# Patient Record
Sex: Male | Born: 1940 | Race: White | Hispanic: No | Marital: Married | Smoking: Former smoker
Health system: Southern US, Community
[De-identification: ages and names within clinical notes are randomized; demographics above are authoritative.]

## PROBLEM LIST (undated history)

## (undated) DIAGNOSIS — I10 Essential (primary) hypertension: Secondary | ICD-10-CM

## (undated) DIAGNOSIS — N183 Chronic kidney disease, stage 3 unspecified: Secondary | ICD-10-CM

## (undated) DIAGNOSIS — I639 Cerebral infarction, unspecified: Secondary | ICD-10-CM

## (undated) DIAGNOSIS — C61 Malignant neoplasm of prostate: Secondary | ICD-10-CM

## (undated) DIAGNOSIS — I350 Nonrheumatic aortic (valve) stenosis: Secondary | ICD-10-CM

## (undated) DIAGNOSIS — I2510 Atherosclerotic heart disease of native coronary artery without angina pectoris: Secondary | ICD-10-CM

---

## 1998-02-19 ENCOUNTER — Emergency Department (HOSPITAL_COMMUNITY): Admission: EM | Admit: 1998-02-19 | Discharge: 1998-02-20 | Payer: Self-pay | Admitting: Emergency Medicine

## 1998-09-24 ENCOUNTER — Encounter: Payer: Self-pay | Admitting: Urology

## 1998-09-26 ENCOUNTER — Inpatient Hospital Stay (HOSPITAL_COMMUNITY): Admission: RE | Admit: 1998-09-26 | Discharge: 1998-09-27 | Payer: Self-pay | Admitting: Urology

## 1998-10-10 ENCOUNTER — Encounter: Payer: Self-pay | Admitting: Emergency Medicine

## 1998-10-10 ENCOUNTER — Inpatient Hospital Stay (HOSPITAL_COMMUNITY): Admission: EM | Admit: 1998-10-10 | Discharge: 1998-10-13 | Payer: Self-pay | Admitting: General Surgery

## 2001-03-21 ENCOUNTER — Encounter: Admission: RE | Admit: 2001-03-21 | Discharge: 2001-03-21 | Payer: Self-pay | Admitting: Internal Medicine

## 2001-03-21 ENCOUNTER — Encounter: Payer: Self-pay | Admitting: Internal Medicine

## 2002-10-20 ENCOUNTER — Encounter: Payer: Self-pay | Admitting: Emergency Medicine

## 2002-10-20 ENCOUNTER — Inpatient Hospital Stay (HOSPITAL_COMMUNITY): Admission: EM | Admit: 2002-10-20 | Discharge: 2002-10-21 | Payer: Self-pay

## 2003-03-12 ENCOUNTER — Encounter: Admission: RE | Admit: 2003-03-12 | Discharge: 2003-03-12 | Payer: Self-pay | Admitting: Internal Medicine

## 2003-03-12 ENCOUNTER — Encounter: Payer: Self-pay | Admitting: Internal Medicine

## 2019-10-04 ENCOUNTER — Other Ambulatory Visit: Payer: Self-pay

## 2019-10-04 ENCOUNTER — Inpatient Hospital Stay (HOSPITAL_COMMUNITY)
Admission: EM | Disposition: A | Discharge status: Home / Self Care | Payer: Self-pay | Source: Home / Self Care | Attending: Cardiovascular Disease

## 2019-10-04 ENCOUNTER — Inpatient Hospital Stay (HOSPITAL_COMMUNITY): Payer: Medicare Other

## 2019-10-04 ENCOUNTER — Emergency Department (HOSPITAL_COMMUNITY): Payer: Medicare Other

## 2019-10-04 ENCOUNTER — Inpatient Hospital Stay (HOSPITAL_COMMUNITY)
Admission: EM | Admit: 2019-10-04 | Discharge: 2019-10-09 | DRG: 280 | Disposition: A | Discharge status: Home / Self Care | Payer: Medicare Other | Attending: Cardiovascular Disease | Admitting: Cardiovascular Disease

## 2019-10-04 DIAGNOSIS — Z8673 Personal history of transient ischemic attack (TIA), and cerebral infarction without residual deficits: Secondary | ICD-10-CM

## 2019-10-04 DIAGNOSIS — S2239XA Fracture of one rib, unspecified side, initial encounter for closed fracture: Secondary | ICD-10-CM | POA: Diagnosis not present

## 2019-10-04 DIAGNOSIS — E7439 Other disorders of intestinal carbohydrate absorption: Secondary | ICD-10-CM | POA: Diagnosis present

## 2019-10-04 DIAGNOSIS — R778 Other specified abnormalities of plasma proteins: Secondary | ICD-10-CM

## 2019-10-04 DIAGNOSIS — R000 Tachycardia, unspecified: Secondary | ICD-10-CM | POA: Diagnosis not present

## 2019-10-04 DIAGNOSIS — S271XXA Traumatic hemothorax, initial encounter: Secondary | ICD-10-CM | POA: Diagnosis present

## 2019-10-04 DIAGNOSIS — W001XXA Fall from stairs and steps due to ice and snow, initial encounter: Secondary | ICD-10-CM | POA: Diagnosis present

## 2019-10-04 DIAGNOSIS — I350 Nonrheumatic aortic (valve) stenosis: Secondary | ICD-10-CM | POA: Diagnosis present

## 2019-10-04 DIAGNOSIS — Z20822 Contact with and (suspected) exposure to covid-19: Secondary | ICD-10-CM | POA: Diagnosis present

## 2019-10-04 DIAGNOSIS — D62 Acute posthemorrhagic anemia: Secondary | ICD-10-CM | POA: Diagnosis not present

## 2019-10-04 DIAGNOSIS — J90 Pleural effusion, not elsewhere classified: Secondary | ICD-10-CM | POA: Diagnosis not present

## 2019-10-04 DIAGNOSIS — Z888 Allergy status to other drugs, medicaments and biological substances status: Secondary | ICD-10-CM | POA: Diagnosis not present

## 2019-10-04 DIAGNOSIS — Z885 Allergy status to narcotic agent status: Secondary | ICD-10-CM

## 2019-10-04 DIAGNOSIS — I25119 Atherosclerotic heart disease of native coronary artery with unspecified angina pectoris: Secondary | ICD-10-CM | POA: Diagnosis present

## 2019-10-04 DIAGNOSIS — Z79899 Other long term (current) drug therapy: Secondary | ICD-10-CM

## 2019-10-04 DIAGNOSIS — F419 Anxiety disorder, unspecified: Secondary | ICD-10-CM | POA: Diagnosis present

## 2019-10-04 DIAGNOSIS — J9811 Atelectasis: Secondary | ICD-10-CM | POA: Diagnosis present

## 2019-10-04 DIAGNOSIS — Z87891 Personal history of nicotine dependence: Secondary | ICD-10-CM | POA: Diagnosis not present

## 2019-10-04 DIAGNOSIS — N400 Enlarged prostate without lower urinary tract symptoms: Secondary | ICD-10-CM | POA: Diagnosis present

## 2019-10-04 DIAGNOSIS — J942 Hemothorax: Secondary | ICD-10-CM

## 2019-10-04 DIAGNOSIS — Z7902 Long term (current) use of antithrombotics/antiplatelets: Secondary | ICD-10-CM

## 2019-10-04 DIAGNOSIS — I2584 Coronary atherosclerosis due to calcified coronary lesion: Secondary | ICD-10-CM | POA: Diagnosis present

## 2019-10-04 DIAGNOSIS — K219 Gastro-esophageal reflux disease without esophagitis: Secondary | ICD-10-CM | POA: Diagnosis present

## 2019-10-04 DIAGNOSIS — I213 ST elevation (STEMI) myocardial infarction of unspecified site: Secondary | ICD-10-CM

## 2019-10-04 DIAGNOSIS — I214 Non-ST elevation (NSTEMI) myocardial infarction: Secondary | ICD-10-CM | POA: Diagnosis present

## 2019-10-04 DIAGNOSIS — S2242XA Multiple fractures of ribs, left side, initial encounter for closed fracture: Secondary | ICD-10-CM | POA: Diagnosis present

## 2019-10-04 DIAGNOSIS — I2510 Atherosclerotic heart disease of native coronary artery without angina pectoris: Secondary | ICD-10-CM

## 2019-10-04 DIAGNOSIS — Z923 Personal history of irradiation: Secondary | ICD-10-CM

## 2019-10-04 DIAGNOSIS — Z886 Allergy status to analgesic agent status: Secondary | ICD-10-CM

## 2019-10-04 DIAGNOSIS — Z8546 Personal history of malignant neoplasm of prostate: Secondary | ICD-10-CM

## 2019-10-04 DIAGNOSIS — Z7982 Long term (current) use of aspirin: Secondary | ICD-10-CM | POA: Diagnosis not present

## 2019-10-04 DIAGNOSIS — N179 Acute kidney failure, unspecified: Secondary | ICD-10-CM | POA: Diagnosis not present

## 2019-10-04 DIAGNOSIS — I771 Stricture of artery: Secondary | ICD-10-CM | POA: Diagnosis present

## 2019-10-04 DIAGNOSIS — R0600 Dyspnea, unspecified: Secondary | ICD-10-CM | POA: Diagnosis not present

## 2019-10-04 DIAGNOSIS — Z9689 Presence of other specified functional implants: Secondary | ICD-10-CM

## 2019-10-04 DIAGNOSIS — T380X5A Adverse effect of glucocorticoids and synthetic analogues, initial encounter: Secondary | ICD-10-CM | POA: Diagnosis not present

## 2019-10-04 DIAGNOSIS — E782 Mixed hyperlipidemia: Secondary | ICD-10-CM | POA: Diagnosis present

## 2019-10-04 DIAGNOSIS — I129 Hypertensive chronic kidney disease with stage 1 through stage 4 chronic kidney disease, or unspecified chronic kidney disease: Secondary | ICD-10-CM | POA: Diagnosis present

## 2019-10-04 DIAGNOSIS — M1990 Unspecified osteoarthritis, unspecified site: Secondary | ICD-10-CM | POA: Diagnosis present

## 2019-10-04 DIAGNOSIS — D72829 Elevated white blood cell count, unspecified: Secondary | ICD-10-CM | POA: Diagnosis not present

## 2019-10-04 DIAGNOSIS — N1831 Chronic kidney disease, stage 3a: Secondary | ICD-10-CM | POA: Diagnosis present

## 2019-10-04 DIAGNOSIS — Z4682 Encounter for fitting and adjustment of non-vascular catheter: Secondary | ICD-10-CM

## 2019-10-04 DIAGNOSIS — K5900 Constipation, unspecified: Secondary | ICD-10-CM | POA: Diagnosis not present

## 2019-10-04 HISTORY — DX: Nonrheumatic aortic (valve) stenosis: I35.0

## 2019-10-04 HISTORY — DX: Cerebral infarction, unspecified: I63.9

## 2019-10-04 HISTORY — PX: LEFT HEART CATH AND CORONARY ANGIOGRAPHY: CATH118249

## 2019-10-04 HISTORY — DX: Atherosclerotic heart disease of native coronary artery without angina pectoris: I25.10

## 2019-10-04 HISTORY — DX: Chronic kidney disease, stage 3 unspecified: N18.30

## 2019-10-04 HISTORY — DX: Malignant neoplasm of prostate: C61

## 2019-10-04 HISTORY — DX: Essential (primary) hypertension: I10

## 2019-10-04 LAB — PROTIME-INR
INR: 1 (ref 0.8–1.2)
Prothrombin Time: 13.5 seconds (ref 11.4–15.2)

## 2019-10-04 LAB — TROPONIN I (HIGH SENSITIVITY): Troponin I (High Sensitivity): 75 ng/L — ABNORMAL HIGH (ref <18)

## 2019-10-04 LAB — CBC WITH DIFFERENTIAL/PLATELET
Abs Immature Granulocytes: 0.29 10*3/uL — ABNORMAL HIGH (ref 0.00–0.07)
Basophils Absolute: 0.1 10*3/uL (ref 0.0–0.1)
Basophils Relative: 0 %
Eosinophils Absolute: 0 10*3/uL (ref 0.0–0.5)
Eosinophils Relative: 0 %
HCT: 34.3 % — ABNORMAL LOW (ref 39.0–52.0)
Hemoglobin: 11.7 g/dL — ABNORMAL LOW (ref 13.0–17.0)
Immature Granulocytes: 1 %
Lymphocytes Relative: 11 %
Lymphs Abs: 2.7 10*3/uL (ref 0.7–4.0)
MCH: 32.1 pg (ref 26.0–34.0)
MCHC: 34.1 g/dL (ref 30.0–36.0)
MCV: 94.2 fL (ref 80.0–100.0)
Monocytes Absolute: 1.8 10*3/uL — ABNORMAL HIGH (ref 0.1–1.0)
Monocytes Relative: 8 %
Neutro Abs: 18.7 10*3/uL — ABNORMAL HIGH (ref 1.7–7.7)
Neutrophils Relative %: 80 %
Platelets: 423 10*3/uL — ABNORMAL HIGH (ref 150–400)
RBC: 3.64 MIL/uL — ABNORMAL LOW (ref 4.22–5.81)
RDW: 13 % (ref 11.5–15.5)
WBC: 23.5 10*3/uL — ABNORMAL HIGH (ref 4.0–10.5)
nRBC: 0 % (ref 0.0–0.2)

## 2019-10-04 LAB — CBG MONITORING, ED: Glucose-Capillary: 224 mg/dL — ABNORMAL HIGH (ref 70–99)

## 2019-10-04 LAB — APTT: aPTT: 23 seconds — ABNORMAL LOW (ref 24–36)

## 2019-10-04 LAB — BASIC METABOLIC PANEL
Anion gap: 15 (ref 5–15)
BUN: 49 mg/dL — ABNORMAL HIGH (ref 8–23)
CO2: 21 mmol/L — ABNORMAL LOW (ref 22–32)
Calcium: 9 mg/dL (ref 8.9–10.3)
Chloride: 94 mmol/L — ABNORMAL LOW (ref 98–111)
Creatinine, Ser: 1.94 mg/dL — ABNORMAL HIGH (ref 0.61–1.24)
GFR calc Af Amer: 38 mL/min — ABNORMAL LOW (ref >60)
GFR calc non Af Amer: 32 mL/min — ABNORMAL LOW (ref >60)
Glucose, Bld: 255 mg/dL — ABNORMAL HIGH (ref 70–99)
Potassium: 4 mmol/L (ref 3.5–5.1)
Sodium: 130 mmol/L — ABNORMAL LOW (ref 135–145)

## 2019-10-04 LAB — I-STAT CHEM 8, ED
BUN: 46 mg/dL — ABNORMAL HIGH (ref 8–23)
Calcium, Ion: 1.2 mmol/L (ref 1.15–1.40)
Chloride: 94 mmol/L — ABNORMAL LOW (ref 98–111)
Creatinine, Ser: 1.8 mg/dL — ABNORMAL HIGH (ref 0.61–1.24)
Glucose, Bld: 249 mg/dL — ABNORMAL HIGH (ref 70–99)
HCT: 34 % — ABNORMAL LOW (ref 39.0–52.0)
Hemoglobin: 11.6 g/dL — ABNORMAL LOW (ref 13.0–17.0)
Potassium: 4 mmol/L (ref 3.5–5.1)
Sodium: 130 mmol/L — ABNORMAL LOW (ref 135–145)
TCO2: 23 mmol/L (ref 22–32)

## 2019-10-04 LAB — RESPIRATORY PANEL BY RT PCR (FLU A&B, COVID)
Influenza A by PCR: NEGATIVE
Influenza B by PCR: NEGATIVE
SARS Coronavirus 2 by RT PCR: NEGATIVE

## 2019-10-04 LAB — BRAIN NATRIURETIC PEPTIDE: B Natriuretic Peptide: 72.1 pg/mL (ref 0.0–100.0)

## 2019-10-04 SURGERY — LEFT HEART CATH AND CORONARY ANGIOGRAPHY
Anesthesia: LOCAL

## 2019-10-04 MED ORDER — IOHEXOL 350 MG/ML SOLN
INTRAVENOUS | Status: DC | PRN
  Administered 2019-10-04: 80 mL via INTRA_ARTERIAL

## 2019-10-04 MED ORDER — SODIUM CHLORIDE 0.9 % IV SOLN
INTRAVENOUS | Status: AC | PRN
  Administered 2019-10-04: 10 mL/h via INTRAVENOUS

## 2019-10-04 MED ORDER — IOHEXOL 350 MG/ML SOLN
INTRAVENOUS | Status: AC
  Filled 2019-10-04: qty 1

## 2019-10-04 MED ORDER — LABETALOL HCL 5 MG/ML IV SOLN: 10.0000 mg | INTRAVENOUS | Status: DC | PRN

## 2019-10-04 MED ORDER — SODIUM CHLORIDE 0.9 % IV SOLN: 250.0000 mL | INTRAVENOUS | Status: DC | PRN

## 2019-10-04 MED ORDER — NITROGLYCERIN IN D5W 200-5 MCG/ML-% IV SOLN
0.0000 ug/min | INTRAVENOUS | Status: DC
  Administered 2019-10-04: 5 ug/min via INTRAVENOUS
  Filled 2019-10-04: qty 250

## 2019-10-04 MED ORDER — SODIUM CHLORIDE 0.9% FLUSH: 3.0000 mL | INTRAVENOUS | Status: DC | PRN

## 2019-10-04 MED ORDER — VERAPAMIL HCL 2.5 MG/ML IV SOLN
INTRAVENOUS | Status: DC | PRN
  Administered 2019-10-04: 10 mL via INTRA_ARTERIAL

## 2019-10-04 MED ORDER — ROSUVASTATIN CALCIUM 20 MG PO TABS
20.0000 mg | ORAL_TABLET | Freq: Every day | ORAL | Status: DC
  Administered 2019-10-05 – 2019-10-09 (×5): 20 mg via ORAL
  Filled 2019-10-04 (×5): qty 1

## 2019-10-04 MED ORDER — ALPRAZOLAM 0.5 MG PO TABS
0.5000 mg | ORAL_TABLET | Freq: Every day | ORAL | Status: DC
  Administered 2019-10-04 – 2019-10-08 (×5): 0.5 mg via ORAL
  Filled 2019-10-04 (×3): qty 2
  Filled 2019-10-04: qty 1
  Filled 2019-10-04: qty 2

## 2019-10-04 MED ORDER — SODIUM CHLORIDE 0.9% FLUSH
3.0000 mL | Freq: Two times a day (BID) | INTRAVENOUS | Status: DC
  Administered 2019-10-05 – 2019-10-09 (×9): 3 mL via INTRAVENOUS

## 2019-10-04 MED ORDER — METOPROLOL TARTRATE 25 MG PO TABS
25.0000 mg | ORAL_TABLET | Freq: Two times a day (BID) | ORAL | Status: DC
  Administered 2019-10-04: 25 mg via ORAL
  Filled 2019-10-04 (×2): qty 1

## 2019-10-04 MED ORDER — ASPIRIN EC 81 MG PO TBEC
81.0000 mg | DELAYED_RELEASE_TABLET | Freq: Every day | ORAL | Status: DC
  Administered 2019-10-05: 09:00:00 81 mg via ORAL
  Filled 2019-10-04: qty 1

## 2019-10-04 MED ORDER — HEPARIN SODIUM (PORCINE) 5000 UNIT/ML IJ SOLN
4000.0000 [IU] | Freq: Once | INTRAMUSCULAR | Status: AC
  Administered 2019-10-04: 22:00:00 4000 [IU] via INTRAVENOUS

## 2019-10-04 MED ORDER — FENTANYL CITRATE (PF) 100 MCG/2ML IJ SOLN
25.0000 ug | INTRAMUSCULAR | Status: DC | PRN
  Administered 2019-10-05 – 2019-10-08 (×15): 25 ug via INTRAVENOUS
  Filled 2019-10-04 (×16): qty 2

## 2019-10-04 MED ORDER — ONDANSETRON HCL 4 MG/2ML IJ SOLN: 4.0000 mg | Freq: Four times a day (QID) | INTRAMUSCULAR | Status: DC | PRN

## 2019-10-04 MED ORDER — HEPARIN (PORCINE) IN NACL 1000-0.9 UT/500ML-% IV SOLN
INTRAVENOUS | Status: DC | PRN
  Administered 2019-10-04 (×2): 500 mL

## 2019-10-04 MED ORDER — SODIUM CHLORIDE 0.9 % WEIGHT BASED INFUSION
1.0000 mL/kg/h | INTRAVENOUS | Status: DC
  Administered 2019-10-04 – 2019-10-05 (×2): 1 mL/kg/h via INTRAVENOUS

## 2019-10-04 MED ORDER — ALPRAZOLAM 0.5 MG PO TABS: 0.5000 mg | ORAL_TABLET | Freq: Every day | ORAL | Status: DC | PRN

## 2019-10-04 MED ORDER — LIDOCAINE HCL (PF) 1 % IJ SOLN
INTRAMUSCULAR | Status: AC
  Filled 2019-10-04: qty 30

## 2019-10-04 MED ORDER — HEPARIN (PORCINE) IN NACL 1000-0.9 UT/500ML-% IV SOLN
INTRAVENOUS | Status: AC
  Filled 2019-10-04: qty 1000

## 2019-10-04 MED ORDER — VERAPAMIL HCL 2.5 MG/ML IV SOLN
INTRAVENOUS | Status: AC
  Filled 2019-10-04: qty 2

## 2019-10-04 MED ORDER — EZETIMIBE 10 MG PO TABS
10.0000 mg | ORAL_TABLET | Freq: Every day | ORAL | Status: DC
  Administered 2019-10-04 – 2019-10-08 (×5): 10 mg via ORAL
  Filled 2019-10-04 (×5): qty 1

## 2019-10-04 MED ORDER — NITROGLYCERIN 1 MG/10 ML FOR IR/CATH LAB
INTRA_ARTERIAL | Status: AC
  Filled 2019-10-04: qty 10

## 2019-10-04 MED ORDER — HEPARIN SODIUM (PORCINE) 1000 UNIT/ML IJ SOLN
INTRAMUSCULAR | Status: AC
  Filled 2019-10-04: qty 1

## 2019-10-04 MED ORDER — HYDRALAZINE HCL 20 MG/ML IJ SOLN: 10.0000 mg | INTRAMUSCULAR | Status: DC | PRN

## 2019-10-04 MED ORDER — LIDOCAINE HCL (PF) 1 % IJ SOLN
INTRAMUSCULAR | Status: DC | PRN
  Administered 2019-10-04: 5 mL via SUBCUTANEOUS

## 2019-10-04 SURGICAL SUPPLY — 12 items
CATH 5FR JL3.5 JR4 ANG PIG MP (CATHETERS) ×2 IMPLANT
DEVICE RAD COMP TR BAND LRG (VASCULAR PRODUCTS) ×2 IMPLANT
GLIDESHEATH SLEND SS 6F .021 (SHEATH) ×2 IMPLANT
GUIDEWIRE INQWIRE 1.5J.035X260 (WIRE) ×1 IMPLANT
INQWIRE 1.5J .035X260CM (WIRE) ×2
KIT ENCORE 26 ADVANTAGE (KITS) ×2 IMPLANT
KIT HEART LEFT (KITS) ×2 IMPLANT
PACK CARDIAC CATHETERIZATION (CUSTOM PROCEDURE TRAY) ×2 IMPLANT
SHEATH PROBE COVER 6X72 (BAG) ×2 IMPLANT
SYR MEDRAD MARK 7 150ML (SYRINGE) ×2 IMPLANT
TRANSDUCER W/STOPCOCK (MISCELLANEOUS) ×2 IMPLANT
TUBING CIL FLEX 10 FLL-RA (TUBING) ×2 IMPLANT

## 2019-10-04 NOTE — ED Provider Notes (Signed)
[facility] EMERGENCY DEPARTMENT Provider Note   CSN: [id number] Arrival date & time: 10/04/19  2105     History Chief Complaint  Patient presents with  . Chest Pain  . Fall    Scott Little is a 79 y.o. male.  HPI   This patient is a 79 year old male, very pleasant, history of hypertension, history of carotid artery disease, history of vascular disease for which she takes Plavix.  The patient is treated for essential hypertension and aortic valve calcifications.  The patient does have a history of stroke.  He presents with acute onset of chest pain which occurred approximately 5 hours prior to arrival at 4:00 PM.  This is a heaviness that radiates on the left side of the chest to the left side and both shoulders as well as the neck.  This made him short of breath, he cannot walk without severe symptoms, it is persistent, heavy, associated with shortness of breath nausea and some diaphoresis the latter 2 symptoms which have resolved.  He denies swelling of the legs, denies coughing, denies fever, denies prior heart attack.  He has taken 4 baby aspirin's prior to arrival, comes by ambulance.  Denies taking any other pain medicines prehospital.  Review of the medical record shows that he gets all of his care at outside facilities at [place].  He states he has never had cardiac obstructive disease that he is aware of.  No past medical history on file.  There are no problems to display for this patient.   **The histories are not reviewed yet. Please review them in the "History" navigator section and refresh this [place].     No family history on file.  Social History   Tobacco Use  . Smoking status: Not on file  Substance Use Topics  . Alcohol use: Not on file  . Drug use: Not on file    Home Medications Prior to Admission medications   Not on File    Allergies    Patient has no allergy information on record.  Review of Systems     Review of Systems  All other systems reviewed and are negative.   Physical Exam Updated Vital Signs BP 117/76 (BP Location: Right Arm)   Pulse (!) 107   Temp (!) 96.6 F (35.9 C) (Oral)   Resp (!) 25   SpO2 100%   Physical Exam Vitals and nursing note reviewed.  Constitutional:      General: He is not in acute distress.    Appearance: He is well-developed.  HENT:     Head: Normocephalic and atraumatic.     Mouth/Throat:     Pharynx: No oropharyngeal exudate.  Eyes:     General: No scleral icterus.       Right eye: No discharge.        Left eye: No discharge.     Conjunctiva/sclera: Conjunctivae normal.     Pupils: Pupils are equal, round, and reactive to light.  Neck:     Thyroid: No thyromegaly.     Vascular: No JVD.  Cardiovascular:     Rate and Rhythm: Regular rhythm. Tachycardia present.     Heart sounds: Murmur present. No friction rub. No gallop.   Pulmonary:     Effort: Respiratory distress present.     Breath sounds: Normal breath sounds. No wheezing or rales.     Comments: Tachypneic but normal lung sounds Abdominal:     General: Bowel sounds are normal. There  is no distension.     Palpations: Abdomen is soft. There is no mass.     Tenderness: There is no abdominal tenderness.  Musculoskeletal:        General: No tenderness. Normal range of motion.     Cervical back: Normal range of motion and neck supple.  Lymphadenopathy:     Cervical: No cervical adenopathy.  Skin:    General: Skin is warm and dry.     Findings: No erythema or rash.  Neurological:     Mental Status: He is alert.     Coordination: Coordination normal.  Psychiatric:        Behavior: Behavior normal.     ED Results / Procedures / Treatments   Labs (all labs ordered are listed, but only abnormal results are displayed) Labs Reviewed - No data to display  EKG EKG Interpretation  Date/Time:  Wednesday October 04 2019 21:08:08 EST Ventricular Rate:  107 PR Interval:    QRS  Duration: 95 QT Interval:  284 QTC Calculation: 379 R Axis:   42 Text Interpretation: Sinus tachycardia Probable left atrial enlargement Nonspecific repol abnormality, diffuse leads ST elevation in avR, ST depression in the lateral precordial  leads and limb lead 2 Abnormal ekg significant changes from prior Confirmed by Noemi Chapel [phone]) on 10/04/2019 9:18:05 PM   Radiology DG Chest 1 View  Result Date: 10/05/2019 CLINICAL DATA:  Shortness of breath and chest pain EXAM: CHEST  1 VIEW COMPARISON:  October 04, 2019 FINDINGS: There is a chest tube present on the left inferiorly. There is a minimal left apical pneumothorax. There is consolidation in the left base with suspected left pleural effusion. There is an ill-defined area of opacity in the right base at the level of the anterior right third rib. Heart size and pulmonary vascularity are normal. No adenopathy. There are displaced left sixth, seventh, and eighth rib fractures. IMPRESSION: Chest tube on the left with minimal left apical pneumothorax. Consolidation with suspected contusion in the left base with small left pleural effusion. There may also be pneumonia in this area. There are displaced rib fractures in this area. Ill-defined opacity at the level of the anterior right third rib. Particular attention this area on subsequent evaluations advised. Developing infiltrate in this area cannot be excluded. Stable cardiac silhouette.  Aortic Atherosclerosis (ICD10-I70.0). Electronically Signed   By: Lowella Grip III M.D.   On: 10/05/2019 13:16   CT CHEST WO CONTRAST  Result Date: 10/05/2019 CLINICAL DATA:  [place] several days ago. Worsening left-sided chest pain. EXAM: CT CHEST WITHOUT CONTRAST TECHNIQUE: Multidetector CT imaging of the chest was performed following the standard protocol without IV contrast. COMPARISON:  Chest x-ray 10/04/2019 FINDINGS: Cardiovascular: The heart is normal in size. No pericardial effusion. Moderate tortuosity,  mild ectasia and moderate calcification of the thoracic aorta. There are also moderate calcifications at the aortic valve and there are three-vessel coronary artery calcifications. Mediastinum/Nodes: No mediastinal or hilar mass or adenopathy. No mediastinal hematoma. The esophagus is grossly normal. Lungs/Pleura: There is a large complex left pleural effusion with areas of mixed attenuation consistent with hemothorax and pleural fluid. There is significant overlying left lower lobe atelectasis and some lingular atelectasis also. The right lung is grossly clear. There is fairly significant breathing motion artifact but I do not see any obvious worrisome pulmonary lesions. Underlying emphysematous changes are noted. Upper Abdomen: No significant upper abdominal findings. The liver and spleen appear intact. There appears to be contrast in the kidneys, likely  from prior contrast study. No free air or free fluid. Moderate aortic calcifications. The gallbladder is surgically absent. Musculoskeletal: Numerous displaced left-sided rib fractures as demonstrated on the radiographs. The eleventh rib is fractured posteriorly near its articulation with the spine. Mildly displaced tenth posterolateral rib fracture and small fracture posteriorly near its articulation with the spine. The seventh, eighth and ninth ribs demonstrate significantly displaced fractures posterolaterally. No other definite rib fractures. The sternum is intact. The thoracic vertebral bodies are intact. Surgical changes involving the right shoulder likely from a rotator cuff repair. IMPRESSION: 1. Several displaced left-sided rib fractures as detailed above. 2. Large complex left pleural effusion with areas of mixed attenuation consistent with hemothorax and pleural fluid. Significant overlying atelectasis. No pneumothorax. 3. Underlying emphysematous changes but no pulmonary contusion or worrisome pulmonary lesions. 4. No mediastinal mass or hematoma. 5.  No significant upper abdominal findings. 6. Atherosclerotic calcifications involving the aorta and coronary arteries. Aortic Atherosclerosis (ICD10-I70.0) and Emphysema (ICD10-J43.9). Aortic Atherosclerosis (ICD10-I70.0) and Emphysema (ICD10-J43.9). Electronically Signed   By: Marijo Sanes M.D.   On: 10/05/2019 09:30   CARDIAC CATHETERIZATION  Result Date: 10/04/2019  Prox RCA lesion is 90% stenosed.  RV Branch lesion is 90% stenosed.  RPDA lesion is 60% stenosed.  Dist Cx lesion is 99% stenosed.  2nd Mrg lesion is 70% stenosed.  Prox Cx lesion is 60% stenosed.  Ost LAD to Prox LAD lesion is 30% stenosed.  Mid LAD lesion is 40% stenosed.  1. Multi-vessel CAD 2. Mild to moderate LAD stenosis 3. Moderate proximal Circumflex stenosis. Severe distal Circumflex stenosis (co-dominant). Severe stenosis in the moderate caliber obtuse marginal branch 4. Severe stenosis in the mid RCA. The RV marginal branch has severe stenosis. The PDA has moderate stenosis. 5. Normal LVEDP 6. Moderate aortic stenosis by cath (mean gradient 28.3 mmHg, peak to peak gradient 17 mmHg) Recommendations: He has severe, multi-vessel CAD. There is flow down all of the vessels. He also has at least moderate aortic stenosis. At this time, he has minimal chest pain. LV filling pressures are normal. No evidence of aortic dissection by aortic root angiography. Will admit to the ICU. Will start a beta blocker and continue statin and ASA. I will resume IV heparin 4 hours post sheath pull. The differential includes aortic dissection that could not be seen on the aortic root angiography, PE and acute decompensation due to the combination of CAD and AS. Will arrange an echo in am. Will check a d-dimer tonight. Given renal insufficiency, will not perform CTA tonight to exclude dissection/PE. As above, heparin will be started. Cycle troponin.   DG Chest Port 1 View  Result Date: 10/06/2019 CLINICAL DATA:  Chest tube.  Shortness of breath. EXAM:  PORTABLE CHEST 1 VIEW COMPARISON:  10/05/2019.  CT 10/05/2019. FINDINGS: Left chest tube in stable position. Stable tiny left apical pneumothorax. Mediastinum and hilar structures normal. Heart size normal. Persistent left base infiltrate and left-sided pleural effusion. Previously identified density noted over the anterior right third rib represents subsegmental atelectasis and is improved from prior exam. Displaced left rib fractures again noted. Postsurgical changes right shoulder. IMPRESSION: 1. Persistent left base atelectasis/infiltrate and left-sided pleural effusion. No interim change. 2. Previously identified density noted over the right anterior third rib represented subsegmental atelectasis. Interim improvement from prior exam. 3. Multiple left rib fractures again noted. Stable tiny left apical pneumothorax. Left chest tube in stable position. Electronically Signed   By: Marcello Moores  Register   On: 10/06/2019 07:47  DG Chest Portable 1 View  Result Date: 10/04/2019 CLINICAL DATA:  Shortness of breath and chest pain. EXAM: PORTABLE CHEST 1 VIEW COMPARISON:  None. FINDINGS: Upper normal heart size with normal mediastinal contours. Aortic atherosclerosis dense retrocardiac opacity with moderate left pleural effusion. No evidence of pulmonary edema. No focal airspace disease in the right lung. No pneumothorax. Displaced right seventh and eighth rib fractures of indeterminate acuity. IMPRESSION: 1. Dense retrocardiac opacity with moderate left pleural effusion, may be atelectasis or pneumonia. Recommend radiographic follow-up to document resolution. 2. Displaced right seventh and eighth rib fractures of indeterminate acuity. Aortic Atherosclerosis (ICD10-I70.0). Electronically Signed   By: Keith Rake M.D.   On: 10/04/2019 23:53   ECHOCARDIOGRAM COMPLETE  Result Date: 10/05/2019    ECHOCARDIOGRAM REPORT   Patient Name:   Scott Little Date of Exam: 10/05/2019 Medical Rec #:  [id number]           Height:       67.0 in Accession #:    [id number]         Weight:       166.0 lb Date of Birth:  Dec 13, 1940          BSA:          1.87 m Patient Age:    16 years           BP:           115/66 mmHg Patient Gender: M                  HR:           102 bpm. Exam Location:  Inpatient Procedure: 2D Echo Indications:    aortic stenosis 424.1  History:        Patient has no prior history of Echocardiogram examinations.  Sonographer:    Johny Chess Referring Phys: [place]  1. Left ventricular ejection fraction, by estimation, is 65 to 70%. The left ventricle has hyperdynamic function. The left ventricle has no regional wall motion abnormalities. There is mild left ventricular hypertrophy. Left ventricular diastolic parameters are consistent with Grade I diastolic dysfunction (impaired relaxation).  2. Right ventricular systolic function is normal. The right ventricular size is normal.  3. The mitral valve is normal in structure and function. No evidence of mitral valve regurgitation. No evidence of mitral stenosis.  4. The aortic valve is tricuspid. Aortic valve regurgitation is not visualized. Moderate aortic valve stenosis. Aortic valve area, by VTI measures 1.03 cm. Aortic valve mean gradient measures 22.0 mmHg.  5. The inferior vena cava is normal in size with greater than 50% respiratory variability, suggesting right atrial pressure of 3 mmHg. FINDINGS  Left Ventricle: Left ventricular ejection fraction, by estimation, is 65 to 70%. The left ventricle has hyperdynamic function. The left ventricle has no regional wall motion abnormalities. The left ventricular internal cavity size was normal in size. There is mild left ventricular hypertrophy. Left ventricular diastolic parameters are consistent with Grade I diastolic dysfunction (impaired relaxation). Right Ventricle: The right ventricular size is normal. No increase in right ventricular wall thickness. Right ventricular systolic  function is normal. Left Atrium: Left atrial size was normal in size. Right Atrium: Right atrial size was normal in size. Pericardium: There is no evidence of pericardial effusion. Mitral Valve: The mitral valve is normal in structure and function. No evidence of mitral valve regurgitation. No evidence of mitral valve stenosis. Tricuspid Valve: The tricuspid valve is normal in  structure. Tricuspid valve regurgitation is not demonstrated. Aortic Valve: The aortic valve is tricuspid. Aortic valve regurgitation is not visualized. Moderate aortic stenosis is present. Aortic valve mean gradient measures 22.0 mmHg. Aortic valve peak gradient measures 45.0 mmHg. Aortic valve area, by VTI measures 1.03 cm. Pulmonic Valve: The pulmonic valve was normal in structure. Pulmonic valve regurgitation is not visualized. Aorta: The aortic root is normal in size and structure. Venous: The inferior vena cava is normal in size with greater than 50% respiratory variability, suggesting right atrial pressure of 3 mmHg. IAS/Shunts: No atrial level shunt detected by color flow Doppler.  LEFT VENTRICLE PLAX 2D LVIDd:         4.25 cm  Diastology LVIDs:         3.70 cm  LV e' lateral:   5.00 cm/s LV PW:         0.90 cm  LV E/e' lateral: 14.4 LV IVS:        1.10 cm  LV e' medial:    5.55 cm/s LVOT diam:     2.10 cm  LV E/e' medial:  13.0 LV SV:         54.90 ml LV SV Index:   11.94 LVOT Area:     3.46 cm  RIGHT VENTRICLE RV S prime:     10.80 cm/s LEFT ATRIUM             Index       RIGHT ATRIUM           Index LA diam:        3.40 cm 1.82 cm/m  RA Area:     12.90 cm LA Vol (A2C):   32.5 ml 17.40 ml/m RA Volume:   28.50 ml  15.25 ml/m LA Vol (A4C):   34.2 ml 18.31 ml/m LA Biplane Vol: 36.4 ml 19.48 ml/m  AORTIC VALVE AV Area (Vmax):    0.95 cm AV Area (Vmean):   0.92 cm AV Area (VTI):     1.03 cm AV Vmax:           335.50 cm/s AV Vmean:          208.000 cm/s AV VTI:            0.532 m AV Peak Grad:      45.0 mmHg AV Mean Grad:       22.0 mmHg LVOT Vmax:         92.05 cm/s LVOT Vmean:        55.200 cm/s LVOT VTI:          0.158 m LVOT/AV VTI ratio: 0.30  AORTA Ao Root diam: 3.10 cm MV E velocity: 72.00 cm/s MV A velocity: 128.00 cm/s  SHUNTS MV E/A ratio:  0.56         Systemic VTI:  0.16 m                             Systemic Diam: 2.10 cm Loralie Champagne MD Electronically signed by Loralie Champagne MD Signature Date/Time: 10/05/2019/2:43:59 PM    Final     Procedures .Critical Care Performed by: Noemi Chapel, MD Authorized by: Noemi Chapel, MD   Critical care provider statement:    Critical care time (minutes):  35   Critical care time was exclusive of:  Separately billable procedures and treating other patients and teaching time   Critical care was time spent personally by me on the following  activities:  Blood draw for specimens, development of treatment plan with patient or surrogate, discussions with consultants, evaluation of patient's response to treatment, examination of patient, obtaining history from patient or surrogate, ordering and performing treatments and interventions, ordering and review of laboratory studies, ordering and review of radiographic studies, pulse oximetry, re-evaluation of patient's condition and review of old charts   (including critical care time)  Medications Ordered in ED Medications  nitroGLYCERIN 50 mg in dextrose 5 % 250 mL (0.2 mg/mL) infusion (has no administration in time range)    ED Course  I have reviewed the triage vital signs and the nursing notes.  Pertinent labs & imaging results that were available during my care of the patient were reviewed by me and considered in my medical decision making (see chart for details).    MDM Rules/Calculators/A&P                      The patient is tachycardic and tachypneic, his oxygen level is 100% on room air, he is afebrile and normotensive.  I am very concerned about his EKG which has ST elevation in lead aVR as well as ST depressions  in leads I and aVL as well as lead II.  I see no other signs of ST elevation, no arrhythmia, this is a very concerning EKG for ischemia.  I will call cardiology immediately.  The patient will be started on heparin, nitroglycerin, he is already taken his aspirin.  Cardiac monitoring initiated, this patient is critically ill.  I suspect ischemic EKG findings.  He has no other risk factors for dissection, his blood pressure is 117/76, he does not have any edema or hypoxia to suggest blood clot.  I discussed the care with the on-call STEMI doctor, Dr. Angelena Form, he agrees that after seeing the EKG in this patient with constant and ongoing symptoms he should go to the Cath Lab we will activate STEMI.   Final Clinical Impression(s) / ED Diagnoses Final diagnoses:  ST elevation myocardial infarction (STEMI), unspecified artery [facility])    Rx / DC Orders ED Discharge Orders    None       Noemi Chapel, MD 10/06/19 1314

## 2019-10-04 NOTE — ED Notes (Signed)
Cardiology at bedside.

## 2019-10-04 NOTE — ED Notes (Signed)
Dr. Romeo Apple at bedside. Per MD emergent consent active and paper consent not needed

## 2019-10-04 NOTE — H&P (Signed)
Cardiology Admission History and Physical:   Patient ID: Scott Little MRN: [id number]; DOB: May 09, 1941   Admission date: 10/04/2019  Primary Care Provider: No primary care provider on file. Primary Cardiologist:  Lamar Blinks MD Primary Electrophysiologist:  None none  Chief Complaint:  CP, STEMI  Patient Profile:   Scott Little is a 79 y.o. male with h/o aortic stenosis (moderate on last TTE done in November 2020) but no documented prior h/o CAD, presents to ED tonight c/o CP; his EKG is abnormal w/ STE in aVR c/w STEMI.  History of Present Illness:   Scott Little has no prior h/o CAD, but does have h/o moderate AS, follows w/ cardiology at [facility], presents to ED tonight after sudden rest-onset chest heaviness/pressure 10/10 in the midsternal and L chest area. He has never had discomfort like this in the past. It was associated with SOB, diaphoresis, but no nausea or palpitations. NTG in ED brought some relief, but he was still having active 4/10 pain when I saw him in ED.  Of note, he did mention to his cardiologist when he saw them last in Oct 2020 he had begun noticing some exertional dyspnea. TTE was repeated at that time which showed AS to still be in the moderate range. He was due for repeat TTE again on 12-11-19.  Heart Pathway Score:     Past Medical History:  Diagnosis Date  . Allergic rhinitis  . BPH (benign prostatic hypertrophy)  . Cancer (*)  prostate  . Cataract  . CVA (cerebral vascular accident) (*) - 2 small acute infarcts in the right thalamus seen on MRI brain 10-15-2016 10/15/2016  . GERD (gastroesophageal reflux disease)  . Glucose intolerance (impaired glucose tolerance)  . HA (headache)  . Heart murmur  NO SYMPTOMS PER PT. 12/06/15  . History of radiation therapy  receiving daily rads treatment daily for prostate ca  . Hyperlipidemia  . Hypertension  UNDER CONTROL PER PT. 12/06/15  . OA (osteoarthritis)  . RLS (restless legs syndrome)    Medication Sig Dispense Refill  . ALPRAZolam (XANAX) 0.5 mg tablet Take one tablet (0.5 mg dose) by mouth daily as needed for Sleep or Anxiety. 90 tablet 0  . chlorthalidone (HYGROTEN) 25 mg tablet TAKE 1 TABLET BY MOUTH DAILY 90 tablet 1  . clopidogrel bisulfate (PLAVIX) 75 mg tablet Take one tablet (75 mg dose) by mouth daily. 90 tablet 0  . colchicine 0.6 mg tablet Take one tablet (0.6 mg dose) by mouth 2 (two) times daily. Take until symptoms resolve. 20 tablet 0  . hydrALAZINE HCl (APRESOLINE) 50 mg tablet TAKE ONE TABLET (50 MG DOSE) BY MOUTH 2 (TWO) TIMES DAILY. 60 tablet 5  . lisinopril (PRINIVIL,ZESTRIL) 40 mg tablet TAKE 1 TABLET BY MOUTH IN THE MORNING AND TAKE 1/2 TABLET IN THE EVENING 135 tablet 1  . omeprazole (PRILOSEC) 20 mg capsule TAKE 1 CAPSULE BY MOUTH TWICE DAILY 180 capsule 1  . rosuvastatin calcium (CRESTOR) 5 mg tablet TAKE 1 TABLET BY MOUTH EVERY DAY 90 tablet 0  . Artificial Tear Solution (SOOTHE XP) SOLN Place 1 drop into both eyes daily as needed.  . cetirizine (ZYRTEC) 10 mg tablet Take one tablet (10 mg dose) by mouth daily. 30 tablet 2  . Cyanocobalamin (VITAMIN B12) 500 MCG TABS Take 1 tablet by mouth 2 (two) times daily.  Marland Kitchen ezetimibe (ZETIA) 10 MG tablet Take one tablet (10 mg dose) by mouth daily. 90 tablet 3  . gentamicin (GARAMYCIN) 0.3% ophthalmic  solution Place one drop into both eyes 3 (three) times a day. 5 mL 0  . omega-3 acid ethyl esters (LOVAZA) 1 g capsule Take two capsules (2 g dose) by mouth 2 (two) times daily. 360 capsule 3  . Polyethyl Glycol-Propyl Glycol (SYSTANE OP) Apply 1 drop to eye daily as needed.      Allergies:    Allergies  Allergen Reactions  . Aspirin Anaphylaxis and Nausea And Vomiting  . Hydrocodone Anaphylaxis and Nausea And Vomiting  . Hydrocodone-Acetaminophen Anaphylaxis  . Oxycodone Anaphylaxis  . Fenofibrate Other (See Comments)    Leg pain  . Gemfibrozil Other (See Comments)    Muscle cramps  . Statins Other (See  Comments)    Muscle aches/myalgias  . Hydrochlorothiazide Other (See Comments)    Cramps  . Tramadol Other (See Comments)    Constipation    Social History:    Social History: Social History   Socioeconomic History  . Marital status: Married  Spouse name: Katharine Look  . Number of children: 3  . Years of education: 60  . Highest education level: Not on file  Occupational History  . Not on file  Social Needs  . Financial resource strain: Not on file  . Food insecurity  Worry: Not on file  Inability: Not on file  . Transportation needs  Medical: Not on file  Non-medical: Not on file  Tobacco Use  . Smoking status: Former Research scientist (life sciences)  . Smokeless tobacco: Never Used  . Tobacco comment: Smoking History Packs/day: Quit 10 years ago  Substance and Sexual Activity  . Alcohol use: No  . Drug use: No  . Sexual activity: Never  Lifestyle  . Physical activity  Days per week: Not on file  Minutes per session: Not on file  . Stress: Not on file  Relationships  . Social Medical illustrator on phone: Not on file  Gets together: Not on file  Attends religious service: Not on file  Active member of club or organization: Not on file  Attends meetings of clubs or organizations: Not on file  Relationship status: Not on file  . Intimate partner violence  Fear of current or ex partner: Not on file  Emotionally abused: Not on file  Physically abused: Not on file  Forced sexual activity: Not on file  Other Topics Concern  . Not on file  Social History Narrative   Family History:   Non-contributory    ROS:  Please see the history of present illness.  All other ROS reviewed and negative.     Physical Exam/Data:   Vitals:   10/04/19 2112 10/04/19 2115 10/04/19 2122 10/04/19 2145  BP: 117/76 111/79  102/81  Pulse: (!) 107 (!) 114  (!) 108  Resp: (!) 25 (!) 27  (!) 25  Temp: (!) 96.6 F (35.9 C)     TempSrc: Oral     SpO2: 100% 100%  95%  Weight:   75.3 kg   Height:   5\' 7"  (1.702  m)    No intake or output data in the 24 hours ending 10/04/19 2209 Last 3 Weights 10/04/2019  Weight (lbs) 166 lb  Weight (kg) 75.297 kg     Body mass index is 26 kg/m.  General:  Well nourished, well developed, appears in mild distress and diaphoretic HEENT: normal Lymph: no adenopathy Neck: no JVD Endocrine:  No thryomegaly Vascular: No carotid bruits; DP not palpable Cardiac:  normal S1, S2; RRR; 2/6 sys murmur at the base Lungs:  clear to auscultation bilaterally, no wheezing, rhonchi or rales  Abd: soft, nontender, no hepatomegaly  Ext: no edema Musculoskeletal:  No deformities, BUE and BLE strength normal and equal Skin: warm and dry  Neuro:  CNs 2-12 intact, no focal abnormalities noted Psych:  Normal affect    EKG:  The ECG that was done 10-04-19 was personally reviewed and demonstrates NSR with 30mm STE in aVR, ST depression inferior  Relevant CV Studies: 07-05-19 TTE Left Ventricle: Systolic function is normal with an ejection fraction  of 55-65%. Quantitative analysis of left ventricle Global Longitudinal  Strain (GLS) imaging is consistent with abnormal at -13. . Aortic Valve: There is moderate stenosis (Vmax 3.56m/s, peak and mean gradients 39 and 98mmHg respectively; calculated AVA 1.33 by VTI) . Mitral Valve: There is mild regurgitation.  Laboratory Data:  High Sensitivity Troponin:   Recent Labs  Lab 10/04/19 2119  TROPONINIHS 75*      Chemistry Recent Labs  Lab 10/04/19 2119 10/04/19 2132  NA 130* 130*  K 4.0 4.0  CL 94* 94*  CO2 21*  --   GLUCOSE 255* 249*  BUN 49* 46*  CREATININE 1.94* 1.80*  CALCIUM 9.0  --   GFRNONAA 32*  --   GFRAA 38*  --   ANIONGAP 15  --     No results for input(s): PROT, ALBUMIN, AST, ALT, ALKPHOS, BILITOT in the last 168 hours. Hematology Recent Labs  Lab 10/04/19 2119 10/04/19 2132  WBC 23.5*  --   RBC 3.64*  --   HGB 11.7* 11.6*  HCT 34.3* 34.0*  MCV 94.2  --   MCH 32.1  --   MCHC 34.1  --   RDW  13.0  --   PLT 423*  --    BNP Recent Labs  Lab 10/04/19 2119  BNP 72.1    DDimer No results for input(s): DDIMER in the last 168 hours.   Radiology/Studies:  No results found.     TIMI Risk Score for ST  Elevation MI:   The patient's TIMI risk score is 8, which indicates a 26.8% risk of all cause mortality at 30 days.    Assessment and Plan:   1. CAD: EKG suggests LM or pLAD disease. Urgent LHC for further evaluation. Will risk stratify w/ FLP, TSH, A1c. Will get repeat TTE in AM.   2.  AS: repeat TTE as above  3. HTN/dyslipidemia/glucose intolerance: restart home meds and adjust PRN  4. H/o CVA: no acute neuro issues.    For questions or updates, please contact [facility] Please consult [email or web address] for contact info under        Signed, Rudean Curt, MD, [facility]  10/04/2019 10:09 PM

## 2019-10-04 NOTE — ED Triage Notes (Signed)
Pt came in from EMS from home. Pt had a fall on Saturday AM. C/O chest pain that radiates to the back and central and SOB. On 4 L [place]. Hypertensive on monitor.

## 2019-10-04 NOTE — Progress Notes (Signed)
This chaplain responded to Code Stemi in ED-Rm 20.  At the time of the visit, the RN-Jen informed the chaplain family has been notified.  The chaplain offered F/U spiritual care as needed.

## 2019-10-04 NOTE — ED Notes (Signed)
Pharmacy at bedside

## 2019-10-05 ENCOUNTER — Inpatient Hospital Stay (HOSPITAL_COMMUNITY): Payer: Medicare Other

## 2019-10-05 ENCOUNTER — Encounter (HOSPITAL_COMMUNITY): Payer: Self-pay | Admitting: Cardiovascular Disease

## 2019-10-05 DIAGNOSIS — I350 Nonrheumatic aortic (valve) stenosis: Secondary | ICD-10-CM

## 2019-10-05 DIAGNOSIS — S2239XA Fracture of one rib, unspecified side, initial encounter for closed fracture: Secondary | ICD-10-CM

## 2019-10-05 DIAGNOSIS — S271XXA Traumatic hemothorax, initial encounter: Secondary | ICD-10-CM

## 2019-10-05 DIAGNOSIS — S2242XA Multiple fractures of ribs, left side, initial encounter for closed fracture: Secondary | ICD-10-CM

## 2019-10-05 DIAGNOSIS — R0600 Dyspnea, unspecified: Secondary | ICD-10-CM

## 2019-10-05 DIAGNOSIS — J942 Hemothorax: Secondary | ICD-10-CM

## 2019-10-05 DIAGNOSIS — J90 Pleural effusion, not elsewhere classified: Secondary | ICD-10-CM

## 2019-10-05 LAB — BASIC METABOLIC PANEL
Anion gap: 12 (ref 5–15)
BUN: 49 mg/dL — ABNORMAL HIGH (ref 8–23)
CO2: 21 mmol/L — ABNORMAL LOW (ref 22–32)
Calcium: 8.5 mg/dL — ABNORMAL LOW (ref 8.9–10.3)
Chloride: 99 mmol/L (ref 98–111)
Creatinine, Ser: 2.09 mg/dL — ABNORMAL HIGH (ref 0.61–1.24)
GFR calc Af Amer: 34 mL/min — ABNORMAL LOW (ref >60)
GFR calc non Af Amer: 30 mL/min — ABNORMAL LOW (ref >60)
Glucose, Bld: 178 mg/dL — ABNORMAL HIGH (ref 70–99)
Potassium: 4.2 mmol/L (ref 3.5–5.1)
Sodium: 132 mmol/L — ABNORMAL LOW (ref 135–145)

## 2019-10-05 LAB — TROPONIN I (HIGH SENSITIVITY)
Troponin I (High Sensitivity): 79 ng/L — ABNORMAL HIGH (ref <18)
Troponin I (High Sensitivity): 83 ng/L — ABNORMAL HIGH (ref <18)
Troponin I (High Sensitivity): 84 ng/L — ABNORMAL HIGH (ref <18)

## 2019-10-05 LAB — CBC
HCT: 30.4 % — ABNORMAL LOW (ref 39.0–52.0)
Hemoglobin: 10.5 g/dL — ABNORMAL LOW (ref 13.0–17.0)
MCH: 32.2 pg (ref 26.0–34.0)
MCHC: 34.5 g/dL (ref 30.0–36.0)
MCV: 93.3 fL (ref 80.0–100.0)
Platelets: 297 10*3/uL (ref 150–400)
RBC: 3.26 MIL/uL — ABNORMAL LOW (ref 4.22–5.81)
RDW: 13.1 % (ref 11.5–15.5)
WBC: 17.3 10*3/uL — ABNORMAL HIGH (ref 4.0–10.5)
nRBC: 0 % (ref 0.0–0.2)

## 2019-10-05 LAB — D-DIMER, QUANTITATIVE: D-Dimer, Quant: 1.06 ug/mL-FEU — ABNORMAL HIGH (ref 0.00–0.50)

## 2019-10-05 LAB — ECHOCARDIOGRAM COMPLETE
Height: 67 in
Weight: 2656 oz

## 2019-10-05 LAB — MRSA PCR SCREENING: MRSA by PCR: NEGATIVE

## 2019-10-05 MED ORDER — METOPROLOL TARTRATE 12.5 MG HALF TABLET
12.5000 mg | ORAL_TABLET | Freq: Two times a day (BID) | ORAL | Status: DC
  Administered 2019-10-05 – 2019-10-07 (×4): 12.5 mg via ORAL
  Filled 2019-10-05 (×4): qty 1

## 2019-10-05 MED ORDER — LIDOCAINE HCL (PF) 1 % IJ SOLN
INTRAMUSCULAR | Status: AC
  Administered 2019-10-05: 13:00:00 30 mL
  Filled 2019-10-05: qty 30

## 2019-10-05 MED ORDER — CHLORHEXIDINE GLUCONATE CLOTH 2 % EX PADS
6.0000 | MEDICATED_PAD | Freq: Every day | CUTANEOUS | Status: DC
  Administered 2019-10-06 – 2019-10-09 (×4): 6 via TOPICAL

## 2019-10-05 MED ORDER — METOPROLOL TARTRATE 25 MG PO TABS
25.0000 mg | ORAL_TABLET | Freq: Two times a day (BID) | ORAL | Status: DC
  Administered 2019-10-05: 25 mg via ORAL
  Filled 2019-10-05: qty 1

## 2019-10-05 MED ORDER — HEPARIN (PORCINE) 25000 UT/250ML-% IV SOLN
900.0000 [IU]/h | INTRAVENOUS | Status: DC
  Administered 2019-10-05: 900 [IU]/h via INTRAVENOUS
  Filled 2019-10-05: qty 250

## 2019-10-05 NOTE — Progress Notes (Signed)
Progress Note  Patient Name: Scott Little Date of Encounter: 10/05/2019  Primary Cardiologist: Lamar Blinks [facility])  Subjective   Chest pain improved. Only having chest pain now when he touches his chest wall or moves his left arm. Still with dyspnea.   Inpatient Medications    Scheduled Meds:  ALPRAZolam  0.5 mg Oral QHS   aspirin EC  81 mg Oral Daily   ezetimibe  10 mg Oral QHS   metoprolol tartrate  25 mg Oral BID   rosuvastatin  20 mg Oral Daily   sodium chloride flush  3 mL Intravenous Q12H   Continuous Infusions:  sodium chloride     heparin 900 Units/hr (10/05/19 0700)   nitroGLYCERIN 5 mcg/min (10/04/19 2129)   PRN Meds: sodium chloride, ALPRAZolam, fentaNYL (SUBLIMAZE) injection, ondansetron (ZOFRAN) IV, sodium chloride flush   Vital Signs    Vitals:   10/05/19 0530 10/05/19 0600 10/05/19 0630 10/05/19 0700  BP: (!) 89/53 (!) 88/52 (!) 90/59 139/73  Pulse: 87 87 86 (!) 103  Resp: (!) 23 (!) 26 (!) 29 (!) 27  Temp:      TempSrc:      SpO2: 93% 93% 93% 93%  Weight:      Height:        Intake/Output Summary (Last 24 hours) at 10/05/2019 0731 Last data filed at 10/05/2019 0700 Gross per 24 hour  Intake 614.12 ml  Output 315 ml  Net 299.12 ml   Last 3 Weights 10/04/2019  Weight (lbs) 166 lb  Weight (kg) 75.297 kg      Telemetry    Sinus tachycardia - Personally Reviewed  ECG    No AM EKG- Personally Reviewed  Physical Exam   GEN: No acute distress.   Neck: No JVD Cardiac: RRR, systolic murmur.   Respiratory: Clear to auscultation on the right, decreased BS left lung base GI: Soft, nontender, non-distended  MS: No edema; No deformity. Neuro:  Nonfocal  Psych: Normal affect   Labs    High Sensitivity Troponin:   Recent Labs  Lab 10/04/19 2119 10/04/19 2334 10/05/19 0238 10/05/19 0528  TROPONINIHS 75* 79* 83* 84*      Chemistry Recent Labs  Lab 10/04/19 2119 10/04/19 2132 10/05/19 0238  NA 130* 130* 132*  K  4.0 4.0 4.2  CL 94* 94* 99  CO2 21*  --  21*  GLUCOSE 255* 249* 178*  BUN 49* 46* 49*  CREATININE 1.94* 1.80* 2.09*  CALCIUM 9.0  --  8.5*  GFRNONAA 32*  --  30*  GFRAA 38*  --  34*  ANIONGAP 15  --  12     Hematology Recent Labs  Lab 10/04/19 2119 10/04/19 2132 10/05/19 0238  WBC 23.5*  --  17.3*  RBC 3.64*  --  3.26*  HGB 11.7* 11.6* 10.5*  HCT 34.3* 34.0* 30.4*  MCV 94.2  --  93.3  MCH 32.1  --  32.2  MCHC 34.1  --  34.5  RDW 13.0  --  13.1  PLT 423*  --  297    BNP Recent Labs  Lab 10/04/19 2119  BNP 72.1     DDimer  Recent Labs  Lab 10/04/19 2334  DDIMER 1.06*     Radiology    CARDIAC CATHETERIZATION  Result Date: 10/04/2019  Prox RCA lesion is 90% stenosed.  RV Branch lesion is 90% stenosed.  RPDA lesion is 60% stenosed.  Dist Cx lesion is 99% stenosed.  2nd Mrg lesion is 70%  stenosed.  Prox Cx lesion is 60% stenosed.  Ost LAD to Prox LAD lesion is 30% stenosed.  Mid LAD lesion is 40% stenosed.  1. Multi-vessel CAD 2. Mild to moderate LAD stenosis 3. Moderate proximal Circumflex stenosis. Severe distal Circumflex stenosis (co-dominant). Severe stenosis in the moderate caliber obtuse marginal branch 4. Severe stenosis in the mid RCA. The RV marginal branch has severe stenosis. The PDA has moderate stenosis. 5. Normal LVEDP 6. Moderate aortic stenosis by cath (mean gradient 28.3 mmHg, peak to peak gradient 17 mmHg) Recommendations: He has severe, multi-vessel CAD. There is flow down all of the vessels. He also has at least moderate aortic stenosis. At this time, he has minimal chest pain. LV filling pressures are normal. No evidence of aortic dissection by aortic root angiography. Will admit to the ICU. Will start a beta blocker and continue statin and ASA. I will resume IV heparin 4 hours post sheath pull. The differential includes aortic dissection that could not be seen on the aortic root angiography, PE and acute decompensation due to the combination of  CAD and AS. Will arrange an echo in am. Will check a d-dimer tonight. Given renal insufficiency, will not perform CTA tonight to exclude dissection/PE. As above, heparin will be started. Cycle troponin.   DG Chest Portable 1 View  Result Date: 10/04/2019 CLINICAL DATA:  Shortness of breath and chest pain. EXAM: PORTABLE CHEST 1 VIEW COMPARISON:  None. FINDINGS: Upper normal heart size with normal mediastinal contours. Aortic atherosclerosis dense retrocardiac opacity with moderate left pleural effusion. No evidence of pulmonary edema. No focal airspace disease in the right lung. No pneumothorax. Displaced right seventh and eighth rib fractures of indeterminate acuity. IMPRESSION: 1. Dense retrocardiac opacity with moderate left pleural effusion, may be atelectasis or pneumonia. Recommend radiographic follow-up to document resolution. 2. Displaced right seventh and eighth rib fractures of indeterminate acuity. Aortic Atherosclerosis (ICD10-I70.0). Electronically Signed   By: Keith Rake M.D.   On: 10/04/2019 23:53    Cardiac Studies     Patient Profile     79 y.o. male wih history of prior CVA on Plavix, carotid artery disease, aortic stenosis, CAD admitted after acute worsening of dyspnea and chest pain on 10/04/19. His EKG was concerning for ischemia. Emergent cardiac cath with multi-vessel CAD with flow down all vessels.   Assessment & Plan    1. CAD/NSTEMI: Cardiac cath with severe mid RCA stenosis, severe distal Circumflex stenosis but flow down all vessels. Troponin minimally elevated. He will need revascularization but will need to sort out his pulmonary issues and assess his aortic valve prior to deciding if PCI is the best option or bypass. Continue ASA, statin and beta blocker. Continue IV heparin.   2. Aortic stenosis: Will plan echo this am to assess  3. Pulmonary effusion/opacity: will ask Pulmonary to see him today. He had a recent fall. Unclear if this represents an infiltrate  or post-traumatic effusion. Will arrange a non-contrast CT of the chest today to get a better look at what is going on.   4. Chronic kidney disease, stage 3: Renal function overall stable post cath.   For questions or updates, please contact [facility] Please consult [email or web address] for contact info under        Signed, Lauree Chandler, MD  10/05/2019, 7:31 AM
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
## 2019-10-05 NOTE — Consult Note (Signed)
NAME:  Scott Little, MRN:  [id number], DOB:  [birth year], LOS: 1 ADMISSION DATE:  10/04/2019, CONSULTATION DATE: 2/18 REFERRING MD: Dr. Angelena Form, CHIEF COMPLAINT: Pleural effusion  Brief History   79 year old male admitted with chest pain.  Left heart cath with noncritical stenosis.  Noted to have a left-sided pleural effusion on chest x-ray and pulmonary was consulted for further evaluation.  History of present illness   79 year old male with PMH significant for HTN, HLD, CVA (2 small right thalamic infarcts in 2018), prostate cancer, CKD III, and moderate AS.  He smoked two packs per day for many (20+) years. Quit 26 years ago. He presented to [facility] emergency department on 2/18 with complaints of chest pain and dyspnea. Symptoms began on 2/13 after a slip and fall on ice at home.  There is radiation of pain to his left chest and both shoulders.  Also associated dyspnea and nausea.  EKG in the emergency department concerning for ST elevation in leads I, II, and aVL.  Cardiology was consulted and the Cath Lab was activated for code STEMI.  He was found to have severe mid RCA stenosis and severe distal circumflex stenosis, but there was still flow down all the vessels.  No revascularization was done at that time due to concern for pulmonary issues including moderate left-sided pleural effusion and 2 right-sided rib fractures presumably after his recent fall.  PCCM consulted for evaluation of pleural effusion.  Past Medical History   has a past medical history of Aortic stenosis, CAD (coronary artery disease), CKD (chronic kidney disease), stage III, CVA (cerebral vascular accident) ([place]), HTN (hypertension), and Prostate CA ([place]).  Significant Hospital Events   2/17 admit for STEMI: RCA and LCX stenosis on cath. No PCI done.   Consults:  Pulm  Procedures:  LHC 2/17 >  Multivessel CAD. No PCI done.   Significant Diagnostic Tests:  CT chest 2/18 >>>  Micro Data:    Antimicrobials:      Interim history/subjective:    Objective   Blood pressure (!) 110/59, pulse (!) 103, temperature 97.7 F (36.5 C), temperature source Oral, resp. rate (!) 25, height 5\' 7"  (1.702 m), weight 75.3 kg, SpO2 90 %.        Intake/Output Summary (Last 24 hours) at 10/05/2019 0836 Last data filed at 10/05/2019 0800 Gross per 24 hour  Intake 938.42 ml  Output 315 ml  Net 623.42 ml   Filed Weights   10/04/19 2122  Weight: 75.3 kg    Examination: General: Elderly appearing gentleman of normal body habitus in no distress HENT: [place]/AT, PERRL, no JVD Lungs: Diminished L base about halfway up back.  Cardiovascular: RRR. 3/6 systolic murmur  Abdomen: Soft, non-tender, non-distended normoactive Extremities: No acute deformity or ROM limitation. No edema.  Neuro: Alert, oriented, non-focal. Very cooperative with exam.   Resolved Hospital Problem list     Assessment & Plan:   Pleural effusion on the left: dyspnea and left sided chest pain began after fall on 4/13 landing on his L side. He has posterior ecchymosis. Hemothorax is high on the differential. O2 saturations currently in the low 90s on room air. He has aortic stenosis, but no known CHF.  - CT chest pending - Echo pending - High concern for hemothorax warrants CVTS consultation - Holding heparin for now - Incentive spirometry and pain management  CAD: multivessel by [facility] 2/17 HTN HLD - management per cardiology  CKD - Trend BMP  CVA history: residual  L hand sensation deficit - supportive care  Best practice:  Diet: Heart heatlhy Pain/Anxiety/Delirium protocol (if indicated): NA VAP protocol (if indicated): NA DVT prophylaxis: on heparin infusion GI prophylaxis: per primary Glucose control: SSI Mobility: BR Code Status: FULL Family Communication: Patient updated bedside Disposition: ICU  Labs   CBC: Recent Labs  Lab 10/04/19 2119 10/04/19 2132 10/05/19 0238  WBC 23.5*  --  17.3*  NEUTROABS 18.7*  --    --   HGB 11.7* 11.6* 10.5*  HCT 34.3* 34.0* 30.4*  MCV 94.2  --  93.3  PLT 423*  --  [id number]    Basic Metabolic Panel: Recent Labs  Lab 10/04/19 2119 10/04/19 2132 10/05/19 0238  NA 130* 130* 132*  K 4.0 4.0 4.2  CL 94* 94* 99  CO2 21*  --  21*  GLUCOSE 255* 249* 178*  BUN 49* 46* 49*  CREATININE 1.94* 1.80* 2.09*  CALCIUM 9.0  --  8.5*   GFR: Estimated Creatinine Clearance: 27.7 mL/min (A) (by C-G formula based on SCr of 2.09 mg/dL (H)). Recent Labs  Lab 10/04/19 2119 10/05/19 0238  WBC 23.5* 17.3*    Liver Function Tests: No results for input(s): AST, ALT, ALKPHOS, BILITOT, PROT, ALBUMIN in the last 168 hours. No results for input(s): LIPASE, AMYLASE in the last 168 hours. No results for input(s): AMMONIA in the last 168 hours.  ABG    Component Value Date/Time   TCO2 23 10/04/2019 2132     Coagulation Profile: Recent Labs  Lab 10/04/19 2119  INR 1.0    Cardiac Enzymes: No results for input(s): CKTOTAL, CKMB, CKMBINDEX, TROPONINI in the last 168 hours.  HbA1C: No results found for: HGBA1C  CBG: Recent Labs  Lab 10/04/19 2151  GLUCAP 224*    Review of Systems:   Bolds are positive  Constitutional: weight loss, gain, night sweats, Fevers, chills, fatigue .  HEENT: headaches, Sore throat, sneezing, nasal congestion, post nasal drip, Difficulty swallowing, Tooth/dental problems, visual complaints visual changes, ear ache CV:  chest pain, radiates:left flank and shoulder ,Orthopnea, PND, swelling in lower extremities, dizziness, palpitations, syncope.  GI  heartburn, indigestion, abdominal pain, nausea, vomiting, diarrhea, change in bowel habits, loss of appetite, bloody stools.  Resp: cough, productive:, hemoptysis, dyspnea, chest pain, pleuritic.  Skin: rash or itching or icterus GU: dysuria, change in color of urine, urgency or frequency. flank pain, hematuria  MS: joint pain or swelling. decreased range of motion  Psych: change in mood or affect.  depression or anxiety.  Neuro: difficulty with speech, weakness, numbness, ataxia    Past Medical History  He,  has no past medical history on file.   Surgical History   History reviewed. No pertinent surgical history.   Social History   reports that he has quit smoking. He has never used smokeless tobacco.   Family History   His family history is not on file.   Allergies Allergies  Allergen Reactions  . Aspirin Anaphylaxis and Nausea And Vomiting  . Hydrocodone Anaphylaxis and Nausea And Vomiting  . Hydrocodone-Acetaminophen Anaphylaxis  . Oxycodone Anaphylaxis  . Fenofibrate Other (See Comments)    Leg pain  . Gemfibrozil Other (See Comments)    Muscle cramps  . Statins Other (See Comments)    Muscle aches/myalgias  . Hydrochlorothiazide Other (See Comments)    Cramps  . Tramadol Other (See Comments)    Constipation     Home Medications  Prior to Admission medications   Medication Sig Start Date End  Date Taking? Authorizing Provider  ALPRAZolam Duanne Moron) 0.5 MG tablet Take 0.5 mg by mouth See admin instructions. Take 0.5 mg by mouth at bedtime and 0.5 mg once daily as needed for anxiety 09/20/19  Yes [provider]  aspirin EC 81 MG tablet Take 81 mg by mouth daily.   Yes [provider]  chlorthalidone (HYGROTON) 25 MG tablet Take 25 mg by mouth 2 (two) times daily. 09/20/19  Yes [provider]  clopidogrel (PLAVIX) 75 MG tablet Take 75 mg by mouth at bedtime.  09/21/19  Yes [provider]  ezetimibe (ZETIA) 10 MG tablet Take 10 mg by mouth at bedtime.  08/28/19  Yes [provider]  hydrALAZINE (APRESOLINE) 50 MG tablet Take 50 mg by mouth See admin instructions. Take 50 mg by mouth one to two times a day if B/P remains high 07/04/19  Yes [provider]  lisinopril (ZESTRIL) 40 MG tablet Take 20-40 mg by mouth daily. Take 40 mg by mouth in the morning and 20 mg at bedtime   Yes [provider]  Polyethyl  Glycol-Propyl Glycol (SYSTANE FREE OP) Place 1-2 drops into both eyes as needed.   Yes [provider]  predniSONE (DELTASONE) 20 MG tablet Take 10-40 mg by mouth See admin instructions. Take 40 mg by mouth once daily for 3 days, 20 mg once daily for 3 days, and 10 mg once daily for 2 days 10/02/19 10/12/19 Yes [provider]  rosuvastatin (CRESTOR) 5 MG tablet Take 5 mg by mouth daily.   Yes [provider]      Georgann Housekeeper, AGACNP-BC [place]  See Amion for personal pager PCCM on call pager [phone]  10/05/2019 9:22 AM

## 2019-10-05 NOTE — Progress Notes (Signed)
  Echocardiogram 2D Echocardiogram has been performed.  Scott Little 10/05/2019, 10:07 AM

## 2019-10-05 NOTE — Progress Notes (Signed)
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 Aspirin Anaphylaxis and Nausea And Vomiting  . Hydrocodone Anaphylaxis and Nausea And Vomiting  . Hydrocodone-Acetaminophen Anaphylaxis  . Oxycodone Anaphylaxis  . Fenofibrate Other (See Comments)    Leg pain  . Gemfibrozil Other (See Comments)    Muscle cramps  . Statins Other (See Comments)    Muscle aches/myalgias  . Hydrochlorothiazide Other (See Comments)    Cramps  . Tramadol Other (See Comments)    Constipation    Patient Measurements: Height: 5\' 7"  (170.2 cm) Weight: 166 lb (75.3 kg) IBW/kg (Calculated) : 66.1  Vital Signs: Temp: 96.6 F (35.9 C) (02/17 2112) Temp Source: Oral (02/17 2112) BP: 108/69 (02/17 2345) Pulse Rate: 102 (02/17 2345)  Labs: Recent Labs    10/04/19 2119 10/04/19 2132  HGB 11.7* 11.6*  HCT 34.3* 34.0*  PLT 423*  --   APTT 23*  --   LABPROT 13.5  --   INR 1.0  --   CREATININE 1.94* 1.80*  TROPONINIHS 75*  --     Estimated Creatinine Clearance: 32.1 mL/min (A) (by C-G formula based on SCr of 1.8 mg/dL (H)).   Medical History: No past medical history on file.  Medications:  Medications Prior to Admission  Medication Sig Dispense Refill Last Dose  . ALPRAZolam (XANAX) 0.5 MG tablet Take 0.5 mg by mouth See admin instructions. Take 0.5 mg by mouth at bedtime and 0.5 mg once daily as needed for anxiety   10/04/2019 at Unknown time  . aspirin EC 81 MG tablet Take 81 mg by mouth daily.   10/04/2019 at 0800  . chlorthalidone (HYGROTON) 25 MG tablet Take 25 mg by mouth 2 (two) times daily.   10/04/2019 at am  . clopidogrel (PLAVIX) 75 MG tablet Take 75 mg by mouth at bedtime.    10/03/2019 at 2200  . ezetimibe (ZETIA) 10 MG tablet Take 10 mg by mouth at bedtime.    10/03/2019 at pm  . hydrALAZINE (APRESOLINE) 50 MG tablet Take 50 mg by mouth See admin instructions. Take 50 mg by mouth  one to two times a day if B/P remains high   unk at [facility]  . lisinopril (ZESTRIL) 40 MG tablet Take 20-40 mg by mouth daily. Take 40 mg by mouth in the morning and 20 mg at bedtime   10/04/2019 at am  . Polyethyl Glycol-Propyl Glycol (SYSTANE FREE OP) Place 1-2 drops into both eyes as needed.   unk at [employer]  . predniSONE (DELTASONE) 20 MG tablet Take 10-40 mg by mouth See admin instructions. Take 40 mg by mouth once daily for 3 days, 20 mg once daily for 3 days, and 10 mg once daily for 2 days   10/04/2019 at Unknown time  . rosuvastatin (CRESTOR) 5 MG tablet Take 5 mg by mouth daily.   10/04/2019 at am   Scheduled:  . ALPRAZolam  0.5 mg Oral QHS  . aspirin EC  81 mg Oral Daily  . ezetimibe  10 mg Oral QHS  . metoprolol tartrate  25 mg Oral BID  . rosuvastatin  20 mg Oral Daily  . sodium chloride flush  3 mL Intravenous Q12H   Infusions:  . sodium chloride    . sodium chloride 1 mL/kg/hr (10/04/19 2321)  . heparin    . 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  Goal of Therapy:  Heparin level 0.3-0.7 units/ml Monitor platelets by anticoagulation protocol: Yes   Plan:  At [id number] will start heparin gtt at 900 units/hr and monitor heparin levels and CBC.  Wynona Neat, PharmD, BCPS  10/05/2019,12:11 AM

## 2019-10-05 NOTE — Procedures (Signed)
Chest Tube Insertion Procedure Note   Indications:  Clinically significant Hemothorax  Pre-operative Diagnosis: Hemothorax  Post-operative Diagnosis: Hemothorax  Procedure Details  Informed consent was obtained for the procedure, including sedation.  Risks of lung perforation, hemorrhage, arrhythmia, and adverse drug reaction were discussed.   After sterile skin prep, using standard technique, a 28 French tube was placed in the left lateral 8 rib space.  Findings: 900 ml of blood fluid obtained  Estimated Blood Loss:  Minimal with exception of immediate drainage of old blood          Specimens:  None              Complications:  None; patient tolerated the procedure well.         Disposition: ICU - extubated and stable.         Condition: stable  Attending Attestation: I performed the procedure.

## 2019-10-06 ENCOUNTER — Inpatient Hospital Stay (HOSPITAL_COMMUNITY): Payer: Medicare Other

## 2019-10-06 DIAGNOSIS — I350 Nonrheumatic aortic (valve) stenosis: Secondary | ICD-10-CM

## 2019-10-06 DIAGNOSIS — J942 Hemothorax: Secondary | ICD-10-CM

## 2019-10-06 DIAGNOSIS — I2510 Atherosclerotic heart disease of native coronary artery without angina pectoris: Secondary | ICD-10-CM

## 2019-10-06 DIAGNOSIS — R778 Other specified abnormalities of plasma proteins: Secondary | ICD-10-CM

## 2019-10-06 LAB — BASIC METABOLIC PANEL
Anion gap: 12 (ref 5–15)
BUN: 59 mg/dL — ABNORMAL HIGH (ref 8–23)
CO2: 22 mmol/L (ref 22–32)
Calcium: 8.4 mg/dL — ABNORMAL LOW (ref 8.9–10.3)
Chloride: 99 mmol/L (ref 98–111)
Creatinine, Ser: 2.13 mg/dL — ABNORMAL HIGH (ref 0.61–1.24)
GFR calc Af Amer: 34 mL/min — ABNORMAL LOW (ref >60)
GFR calc non Af Amer: 29 mL/min — ABNORMAL LOW (ref >60)
Glucose, Bld: 138 mg/dL — ABNORMAL HIGH (ref 70–99)
Potassium: 4.5 mmol/L (ref 3.5–5.1)
Sodium: 133 mmol/L — ABNORMAL LOW (ref 135–145)

## 2019-10-06 LAB — GLUCOSE, CAPILLARY
Glucose-Capillary: 144 mg/dL — ABNORMAL HIGH (ref 70–99)
Glucose-Capillary: 113 mg/dL — ABNORMAL HIGH (ref 70–99)
Glucose-Capillary: 126 mg/dL — ABNORMAL HIGH (ref 70–99)

## 2019-10-06 LAB — CBC
HCT: 27.7 % — ABNORMAL LOW (ref 39.0–52.0)
Hemoglobin: 9.3 g/dL — ABNORMAL LOW (ref 13.0–17.0)
MCH: 31.6 pg (ref 26.0–34.0)
MCHC: 33.6 g/dL (ref 30.0–36.0)
MCV: 94.2 fL (ref 80.0–100.0)
Platelets: 235 10*3/uL (ref 150–400)
RBC: 2.94 MIL/uL — ABNORMAL LOW (ref 4.22–5.81)
RDW: 13.2 % (ref 11.5–15.5)
WBC: 13.3 10*3/uL — ABNORMAL HIGH (ref 4.0–10.5)
nRBC: 0 % (ref 0.0–0.2)

## 2019-10-06 MED ORDER — INSULIN ASPART 100 UNIT/ML ~~LOC~~ SOLN
0.0000 [IU] | Freq: Three times a day (TID) | SUBCUTANEOUS | Status: DC
  Administered 2019-10-06: 12:00:00 1 [IU] via SUBCUTANEOUS
  Administered 2019-10-07: 13:00:00 3 [IU] via SUBCUTANEOUS
  Administered 2019-10-07: 1 [IU] via SUBCUTANEOUS
  Administered 2019-10-08: 2 [IU] via SUBCUTANEOUS
  Administered 2019-10-09: 08:00:00 1 [IU] via SUBCUTANEOUS

## 2019-10-06 MED ORDER — INSULIN ASPART 100 UNIT/ML ~~LOC~~ SOLN: 0.0000 [IU] | Freq: Every day | SUBCUTANEOUS | Status: DC

## 2019-10-06 MED FILL — Heparin Sodium (Porcine) Inj 1000 Unit/ML: INTRAMUSCULAR | Qty: 10 | Status: AC

## 2019-10-06 MED FILL — Nitroglycerin IV Soln 100 MCG/ML in D5W: INTRA_ARTERIAL | Qty: 10 | Status: AC

## 2019-10-06 NOTE — Progress Notes (Addendum)
[address]       [place] [address]             [phone]       2 Days Post-Op Procedure(s) (LRB): LEFT HEART CATH AND CORONARY ANGIOGRAPHY (N/A)  Subjective: Patient with some pain at chest tube site, but not "unbearable".  Objective: Vital signs in last 24 hours: Temp:  [98 F (36.7 C)-98.6 F (37 C)] 98.2 F (36.8 C) (02/19 0400) Pulse Rate:  [92-120] 105 (02/19 0600) Cardiac Rhythm: Normal sinus rhythm (02/19 0000) Resp:  [18-26] 18 (02/19 0600) BP: (96-140)/(55-77) 101/64 (02/19 0600) SpO2:  [90 %-100 %] 94 % (02/19 0600) Weight:  [73.7 kg] 73.7 kg (02/19 0400)     Intake/Output from previous day: 02/18 0701 - 02/19 0700 In: 1539 [P.O.:1440; I.V.:99] Out: 1530 [Urine:450; Chest Tube:1080]   Physical Exam:  Cardiovascular: Slightly tachycardic, III/VI murmur heart best along left sternal border, Pulmonary: Clear to auscultation on the right and diminished left basilar breath sounds Abdomen: Soft, non tender, bowel sounds present. Wounds: Dressing is clean and dry.  Chest Tube: to suction, no air leak  Lab Results: CBC: Recent Labs    10/05/19 0238 10/06/19 0251  WBC 17.3* 13.3*  HGB 10.5* 9.3*  HCT 30.4* 27.7*  PLT 297 235   BMET:  Recent Labs    10/05/19 0238 10/06/19 0251  NA 132* 133*  K 4.2 4.5  CL 99 99  CO2 21* 22  GLUCOSE 178* 138*  BUN 49* 59*  CREATININE 2.09* 2.13*  CALCIUM 8.5* 8.4*    PT/INR:  Recent Labs    10/04/19 2119  LABPROT 13.5  INR 1.0   ABG:  INR: Will add last result for INR, ABG once components are confirmed Will add last 4 CBG results once components are confirmed  Assessment/Plan:  1. CV - Slightly tachycardic. S/p STEMI. CAD, aortic stenosis. Cardiology following 2.  Pulmonary - S/p right chest tube for hemothorax 02/18. On 2 liters of oxygen via [place]. Chest tube with 1080  (100 cc last 12 hours) since placement. Chest tube is to suction and there is no air leak. CXR this am  appears stable (trace right apical pneumothorax). Check CXR in am 3. Anemia-H and H this am decreased to 9.3 and 27.7 4. Renal insufficiency-Creatinine with slight increase to 2.13. Creatinine upon admission 1.94. Management per primary  Donielle M ZimmermanPA-C 10/06/2019,7:01 AM [id number]  Expected Acute  Blood - loss Anemia- continue to monitor  1021ml initial  output from left chest tube, 100 ml additional past 12 hours  Monitor low hct and renal function  cardiac echo- appears stable from 06/2019 Left ventricular ejection fraction, by estimation, is 65 to 70%. The left ventricle has hyperdynamic function. The left ventricle has no regional wall motion abnormalities. There is mild left ventricular hypertrophy. Left ventricular diastolic parameters are consistent with Grade I diastolic dysfunction (impaired relaxation). 2. Right ventricular systolic function is normal. The right ventricular size is normal. 3. The mitral valve is normal in structure and function. No evidence of mitral valve regurgitation. No evidence of mitral stenosis. 4. The aortic valve is tricuspid. Aortic valve regurgitation is not visualized. Moderate aortic valve stenosis. Aortic valve area, by VTI measures 1.03 cm. Aortic valve mean gradient measures 22.0 mmHg. 5. The inferior vena cava is normal in size with greater than 50% respiratory variability, suggesting right atrial pressure of 3 mmHg.  Plan to leave chest tube today and remove in  am if no further drainage  I have seen and examined Shanda Howells and agree with the above assessment  and plan.  Grace Isaac MD [facility] [phone] Office [phone] 10/06/2019 7:49 AM
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
## 2019-10-06 NOTE — Progress Notes (Addendum)
Progress Note  Patient Name: Scott Little Date of Encounter: 10/06/2019  Primary Cardiologist: Lauree Chandler, MD   Lynnell Catalan, MD  Patient profile   79 year old gentleman with past medical history of moderate aortic stenosis, hypertension, CVA, CKD stage III, history of prostate cancer who presented with pressure-like 10 out of 10 substernal chest pain at rest associated with worsening of shortness of breath and diaphoresis.  He had new EKG changes with mild elevation in troponin.  Concerning for STEMI, he was sent to cardiac cath and found to have multivessel disease but with good down flow and decided to manage medically. He was hypotensive and tachycardic meanwhile, found to have left hemothorax and multiple left ribs fracture (with recent history of mechanical fall on stairs and chest trauma).  Left side chest tube placed by CT surgery.  Subjective   Patient was seen and evaluated at bedside this morning. No acute events overnight. He denies any chest pain. Reports some labored breathing sometimes but feels much better today. No other complaints.  Inpatient Medications    Scheduled Meds: . ALPRAZolam  0.5 mg Oral QHS  . Chlorhexidine Gluconate Cloth  6 each Topical Daily  . ezetimibe  10 mg Oral QHS  . metoprolol tartrate  12.5 mg Oral BID  . rosuvastatin  20 mg Oral Daily  . sodium chloride flush  3 mL Intravenous Q12H   Continuous Infusions: . sodium chloride    . nitroGLYCERIN 5 mcg/min (10/04/19 2129)   PRN Meds: sodium chloride, ALPRAZolam, fentaNYL (SUBLIMAZE) injection, ondansetron (ZOFRAN) IV, sodium chloride flush   Vital Signs    Vitals:   10/06/19 0300 10/06/19 0400 10/06/19 0500 10/06/19 0600  BP: 116/62 117/63 104/60 101/64  Pulse: 100 99 (!) 108 (!) 105  Resp: 20 19 (!) 21 18  Temp:  98.2 F (36.8 C)    TempSrc:  Oral    SpO2: 97% 97% 95% 94%  Weight:  73.7 kg    Height:        Intake/Output Summary (Last 24 hours) at  10/06/2019 0750 Last data filed at 10/06/2019 0400 Gross per 24 hour  Intake 1539.04 ml  Output 1530 ml  Net 9.04 ml   Last 3 Weights 10/06/2019 10/04/2019  Weight (lbs) 162 lb 7.7 oz 166 lb  Weight (kg) 73.7 kg 75.297 kg      Telemetry    Sinus tachycardia with PVC- Personally Reviewed  ECG    Not performed today- Personally Reviewed  Physical Exam   GEN:  Pleasant gentleman, sitting on hospital bed in no acute distress.  On 4 L of nasal cannula. Neck: No JVD Cardiac: RRR, 4/6 systolic murmur, no rubs, or gallops.  Chest, respiratory:  Has chest tube at left side, no crepitus or cutaneous emphysema palpated, mildly decreased breath sounds at left lateral chest GI: Soft, nontender, non-distended  MS: No edema; No deformity. Neuro:   Alert and oriented x3, nonfocal  Psych: Normal affect   Labs    High Sensitivity Troponin:   Recent Labs  Lab 10/04/19 2119 10/04/19 2334 10/05/19 0238 10/05/19 0528  TROPONINIHS 75* 79* 83* 84*      Chemistry Recent Labs  Lab 10/04/19 2119 10/04/19 2119 10/04/19 2132 10/05/19 0238 10/06/19 0251  NA 130*   < > 130* 132* 133*  K 4.0   < > 4.0 4.2 4.5  CL 94*   < > 94* 99 99  CO2 21*  --   --  21* 22  GLUCOSE 255*   < > 249* 178* 138*  BUN 49*   < > 46* 49* 59*  CREATININE 1.94*   < > 1.80* 2.09* 2.13*  CALCIUM 9.0  --   --  8.5* 8.4*  GFRNONAA 32*  --   --  30* 29*  GFRAA 38*  --   --  34* 34*  ANIONGAP 15  --   --  12 12   < > = values in this interval not displayed.     Hematology Recent Labs  Lab 10/04/19 2119 10/04/19 2119 10/04/19 2132 10/05/19 0238 10/06/19 0251  WBC 23.5*  --   --  17.3* 13.3*  RBC 3.64*  --   --  3.26* 2.94*  HGB 11.7*   < > 11.6* 10.5* 9.3*  HCT 34.3*   < > 34.0* 30.4* 27.7*  MCV 94.2  --   --  93.3 94.2  MCH 32.1  --   --  32.2 31.6  MCHC 34.1  --   --  34.5 33.6  RDW 13.0  --   --  13.1 13.2  PLT 423*  --   --  297 235   < > = values in this interval not displayed.    BNP Recent  Labs  Lab 10/04/19 2119  BNP 72.1     DDimer  Recent Labs  Lab 10/04/19 2334  DDIMER 1.06*     Radiology    DG Chest 1 View  Result Date: 10/05/2019 CLINICAL DATA:  Shortness of breath and chest pain EXAM: CHEST  1 VIEW COMPARISON:  October 04, 2019 FINDINGS: There is a chest tube present on the left inferiorly. There is a minimal left apical pneumothorax. There is consolidation in the left base with suspected left pleural effusion. There is an ill-defined area of opacity in the right base at the level of the anterior right third rib. Heart size and pulmonary vascularity are normal. No adenopathy. There are displaced left sixth, seventh, and eighth rib fractures. IMPRESSION: Chest tube on the left with minimal left apical pneumothorax. Consolidation with suspected contusion in the left base with small left pleural effusion. There may also be pneumonia in this area. There are displaced rib fractures in this area. Ill-defined opacity at the level of the anterior right third rib. Particular attention this area on subsequent evaluations advised. Developing infiltrate in this area cannot be excluded. Stable cardiac silhouette.  Aortic Atherosclerosis (ICD10-I70.0). Electronically Signed   By: Lowella Grip III M.D.   On: 10/05/2019 13:16   CT CHEST WO CONTRAST  Result Date: 10/05/2019 CLINICAL DATA:  [place] several days ago. Worsening left-sided chest pain. EXAM: CT CHEST WITHOUT CONTRAST TECHNIQUE: Multidetector CT imaging of the chest was performed following the standard protocol without IV contrast. COMPARISON:  Chest x-ray 10/04/2019 FINDINGS: Cardiovascular: The heart is normal in size. No pericardial effusion. Moderate tortuosity, mild ectasia and moderate calcification of the thoracic aorta. There are also moderate calcifications at the aortic valve and there are three-vessel coronary artery calcifications. Mediastinum/Nodes: No mediastinal or hilar mass or adenopathy. No mediastinal  hematoma. The esophagus is grossly normal. Lungs/Pleura: There is a large complex left pleural effusion with areas of mixed attenuation consistent with hemothorax and pleural fluid. There is significant overlying left lower lobe atelectasis and some lingular atelectasis also. The right lung is grossly clear. There is fairly significant breathing motion artifact but I do not see any obvious worrisome pulmonary lesions. Underlying emphysematous changes are noted. Upper Abdomen: No significant  upper abdominal findings. The liver and spleen appear intact. There appears to be contrast in the kidneys, likely from prior contrast study. No free air or free fluid. Moderate aortic calcifications. The gallbladder is surgically absent. Musculoskeletal: Numerous displaced left-sided rib fractures as demonstrated on the radiographs. The eleventh rib is fractured posteriorly near its articulation with the spine. Mildly displaced tenth posterolateral rib fracture and small fracture posteriorly near its articulation with the spine. The seventh, eighth and ninth ribs demonstrate significantly displaced fractures posterolaterally. No other definite rib fractures. The sternum is intact. The thoracic vertebral bodies are intact. Surgical changes involving the right shoulder likely from a rotator cuff repair. IMPRESSION: 1. Several displaced left-sided rib fractures as detailed above. 2. Large complex left pleural effusion with areas of mixed attenuation consistent with hemothorax and pleural fluid. Significant overlying atelectasis. No pneumothorax. 3. Underlying emphysematous changes but no pulmonary contusion or worrisome pulmonary lesions. 4. No mediastinal mass or hematoma. 5. No significant upper abdominal findings. 6. Atherosclerotic calcifications involving the aorta and coronary arteries. Aortic Atherosclerosis (ICD10-I70.0) and Emphysema (ICD10-J43.9). Aortic Atherosclerosis (ICD10-I70.0) and Emphysema (ICD10-J43.9).  Electronically Signed   By: Marijo Sanes M.D.   On: 10/05/2019 09:30   CARDIAC CATHETERIZATION  Result Date: 10/04/2019  Prox RCA lesion is 90% stenosed.  RV Branch lesion is 90% stenosed.  RPDA lesion is 60% stenosed.  Dist Cx lesion is 99% stenosed.  2nd Mrg lesion is 70% stenosed.  Prox Cx lesion is 60% stenosed.  Ost LAD to Prox LAD lesion is 30% stenosed.  Mid LAD lesion is 40% stenosed.  1. Multi-vessel CAD 2. Mild to moderate LAD stenosis 3. Moderate proximal Circumflex stenosis. Severe distal Circumflex stenosis (co-dominant). Severe stenosis in the moderate caliber obtuse marginal branch 4. Severe stenosis in the mid RCA. The RV marginal branch has severe stenosis. The PDA has moderate stenosis. 5. Normal LVEDP 6. Moderate aortic stenosis by cath (mean gradient 28.3 mmHg, peak to peak gradient 17 mmHg) Recommendations: He has severe, multi-vessel CAD. There is flow down all of the vessels. He also has at least moderate aortic stenosis. At this time, he has minimal chest pain. LV filling pressures are normal. No evidence of aortic dissection by aortic root angiography. Will admit to the ICU. Will start a beta blocker and continue statin and ASA. I will resume IV heparin 4 hours post sheath pull. The differential includes aortic dissection that could not be seen on the aortic root angiography, PE and acute decompensation due to the combination of CAD and AS. Will arrange an echo in am. Will check a d-dimer tonight. Given renal insufficiency, will not perform CTA tonight to exclude dissection/PE. As above, heparin will be started. Cycle troponin.   DG Chest Port 1 View  Result Date: 10/06/2019 CLINICAL DATA:  Chest tube.  Shortness of breath. EXAM: PORTABLE CHEST 1 VIEW COMPARISON:  10/05/2019.  CT 10/05/2019. FINDINGS: Left chest tube in stable position. Stable tiny left apical pneumothorax. Mediastinum and hilar structures normal. Heart size normal. Persistent left base infiltrate and  left-sided pleural effusion. Previously identified density noted over the anterior right third rib represents subsegmental atelectasis and is improved from prior exam. Displaced left rib fractures again noted. Postsurgical changes right shoulder. IMPRESSION: 1. Persistent left base atelectasis/infiltrate and left-sided pleural effusion. No interim change. 2. Previously identified density noted over the right anterior third rib represented subsegmental atelectasis. Interim improvement from prior exam. 3. Multiple left rib fractures again noted. Stable tiny left apical pneumothorax. Left chest tube  in stable position. Electronically Signed   By: Marcello Moores  Register   On: 10/06/2019 07:47   DG Chest Portable 1 View  Result Date: 10/04/2019 CLINICAL DATA:  Shortness of breath and chest pain. EXAM: PORTABLE CHEST 1 VIEW COMPARISON:  None. FINDINGS: Upper normal heart size with normal mediastinal contours. Aortic atherosclerosis dense retrocardiac opacity with moderate left pleural effusion. No evidence of pulmonary edema. No focal airspace disease in the right lung. No pneumothorax. Displaced right seventh and eighth rib fractures of indeterminate acuity. IMPRESSION: 1. Dense retrocardiac opacity with moderate left pleural effusion, may be atelectasis or pneumonia. Recommend radiographic follow-up to document resolution. 2. Displaced right seventh and eighth rib fractures of indeterminate acuity. Aortic Atherosclerosis (ICD10-I70.0). Electronically Signed   By: Keith Rake M.D.   On: 10/04/2019 23:53   ECHOCARDIOGRAM COMPLETE  Result Date: 10/05/2019    ECHOCARDIOGRAM REPORT   Patient Name:   Scott Little Date of Exam: 10/05/2019 Medical Rec #:  [id number]          Height:       67.0 in Accession #:    [id number]         Weight:       166.0 lb Date of Birth:  1941-03-31          BSA:          1.87 m Patient Age:    18 years           BP:           115/66 mmHg Patient Gender: M                  HR:            102 bpm. Exam Location:  Inpatient Procedure: 2D Echo Indications:    aortic stenosis 424.1  History:        Patient has no prior history of Echocardiogram examinations.  Sonographer:    Johny Chess Referring Phys: [place]  1. Left ventricular ejection fraction, by estimation, is 65 to 70%. The left ventricle has hyperdynamic function. The left ventricle has no regional wall motion abnormalities. There is mild left ventricular hypertrophy. Left ventricular diastolic parameters are consistent with Grade I diastolic dysfunction (impaired relaxation).  2. Right ventricular systolic function is normal. The right ventricular size is normal.  3. The mitral valve is normal in structure and function. No evidence of mitral valve regurgitation. No evidence of mitral stenosis.  4. The aortic valve is tricuspid. Aortic valve regurgitation is not visualized. Moderate aortic valve stenosis. Aortic valve area, by VTI measures 1.03 cm. Aortic valve mean gradient measures 22.0 mmHg.  5. The inferior vena cava is normal in size with greater than 50% respiratory variability, suggesting right atrial pressure of 3 mmHg. FINDINGS  Left Ventricle: Left ventricular ejection fraction, by estimation, is 65 to 70%. The left ventricle has hyperdynamic function. The left ventricle has no regional wall motion abnormalities. The left ventricular internal cavity size was normal in size. There is mild left ventricular hypertrophy. Left ventricular diastolic parameters are consistent with Grade I diastolic dysfunction (impaired relaxation). Right Ventricle: The right ventricular size is normal. No increase in right ventricular wall thickness. Right ventricular systolic function is normal. Left Atrium: Left atrial size was normal in size. Right Atrium: Right atrial size was normal in size. Pericardium: There is no evidence of pericardial effusion. Mitral Valve: The mitral valve is normal in structure and  function. No evidence  of mitral valve regurgitation. No evidence of mitral valve stenosis. Tricuspid Valve: The tricuspid valve is normal in structure. Tricuspid valve regurgitation is not demonstrated. Aortic Valve: The aortic valve is tricuspid. Aortic valve regurgitation is not visualized. Moderate aortic stenosis is present. Aortic valve mean gradient measures 22.0 mmHg. Aortic valve peak gradient measures 45.0 mmHg. Aortic valve area, by VTI measures 1.03 cm. Pulmonic Valve: The pulmonic valve was normal in structure. Pulmonic valve regurgitation is not visualized. Aorta: The aortic root is normal in size and structure. Venous: The inferior vena cava is normal in size with greater than 50% respiratory variability, suggesting right atrial pressure of 3 mmHg. IAS/Shunts: No atrial level shunt detected by color flow Doppler.  LEFT VENTRICLE PLAX 2D LVIDd:         4.25 cm  Diastology LVIDs:         3.70 cm  LV e' lateral:   5.00 cm/s LV PW:         0.90 cm  LV E/e' lateral: 14.4 LV IVS:        1.10 cm  LV e' medial:    5.55 cm/s LVOT diam:     2.10 cm  LV E/e' medial:  13.0 LV SV:         54.90 ml LV SV Index:   11.94 LVOT Area:     3.46 cm  RIGHT VENTRICLE RV S prime:     10.80 cm/s LEFT ATRIUM             Index       RIGHT ATRIUM           Index LA diam:        3.40 cm 1.82 cm/m  RA Area:     12.90 cm LA Vol (A2C):   32.5 ml 17.40 ml/m RA Volume:   28.50 ml  15.25 ml/m LA Vol (A4C):   34.2 ml 18.31 ml/m LA Biplane Vol: 36.4 ml 19.48 ml/m  AORTIC VALVE AV Area (Vmax):    0.95 cm AV Area (Vmean):   0.92 cm AV Area (VTI):     1.03 cm AV Vmax:           335.50 cm/s AV Vmean:          208.000 cm/s AV VTI:            0.532 m AV Peak Grad:      45.0 mmHg AV Mean Grad:      22.0 mmHg LVOT Vmax:         92.05 cm/s LVOT Vmean:        55.200 cm/s LVOT VTI:          0.158 m LVOT/AV VTI ratio: 0.30  AORTA Ao Root diam: 3.10 cm MV E velocity: 72.00 cm/s MV A velocity: 128.00 cm/s  SHUNTS MV E/A ratio:  0.56          Systemic VTI:  0.16 m                             Systemic Diam: 2.10 cm Loralie Champagne MD Electronically signed by Loralie Champagne MD Signature Date/Time: 10/05/2019/2:43:59 PM    Final     Cardiac Studies   Left heart cath 10/04/2019  Prox RCA lesion is 90% stenosed.  RV Branch lesion is 90% stenosed.  RPDA lesion is 60% stenosed.  Dist Cx lesion is 99% stenosed.  2nd Mrg lesion is 70% stenosed.  Prox Cx lesion is 60% stenosed.  Ost LAD to Prox LAD lesion is 30% stenosed.  Mid LAD lesion is 40% stenosed.   1. Multi-vessel CAD 2. Mild to moderate LAD stenosis 3. Moderate proximal Circumflex stenosis. Severe distal Circumflex stenosis (co-dominant). Severe stenosis in the moderate caliber obtuse marginal branch 4. Severe stenosis in the mid RCA. The RV marginal branch has severe stenosis. The PDA has moderate stenosis.  5. Normal LVEDP 6. Moderate aortic stenosis by cath (mean gradient 28.3 mmHg, peak to peak gradient 17 mmHg)  Recommendations: He has severe, multi-vessel CAD. There is flow down all of the vessels. He also has at least moderate aortic stenosis. At this time, he has minimal chest pain. LV filling pressures are normal. No evidence of aortic dissection by aortic root angiography.   Will admit to the ICU. Will start a beta blocker and continue statin and ASA. I will resume IV heparin 4 hours post sheath pull. The differential includes aortic dissection that could not be seen on the aortic root angiography, PE and acute decompensation due to the combination of CAD and AS. Will arrange an echo in am. Will check a d-dimer tonight. Given renal insufficiency, will not perform CTA tonight to exclude dissection/PE. As above, heparin will be started. Cycle troponin.   Diagnostic Dominance: Co-dominant    IMPRESSIONS   Echo 10/05/2019:  1. Left ventricular ejection fraction, by estimation, is 65 to 70%. The  left ventricle has hyperdynamic function. The left ventricle has  no  regional wall motion abnormalities. There is mild left ventricular  hypertrophy. Left ventricular diastolic  parameters are consistent with Grade I diastolic dysfunction (impaired  relaxation).  2. Right ventricular systolic function is normal. The right ventricular  size is normal.  3. The mitral valve is normal in structure and function. No evidence of  mitral valve regurgitation. No evidence of mitral stenosis.  4. The aortic valve is tricuspid. Aortic valve regurgitation is not  visualized. Moderate aortic valve stenosis. Aortic valve area, by VTI  measures 1.03 cm. Aortic valve mean gradient measures 22.0 mmHg.  5. The inferior vena cava is normal in size with greater than 50%  respiratory variability, suggesting right atrial pressure of 3 mmHg.    Patient Profile     79 y.o. male with past medical history of moderate aortic stenosis, hypertension, CVA, CKD stage III, history of prostate cancer who presented with pressure-like 10 out of 10 substernal chest pain at rest associated with worsening of shortness of breath and diaphoresis.  He had new EKG changes with mild elevation in troponin.  Concerning for STEMI, he was sent to cardiac cath and found to have multivessel disease but with good down flow and decided to manage medically. He was hypotensive and tachycardic meanwhile, found to have left hemothorax and multiple left ribs fracture (with recent history of mechanical fall on stairs and chest trauma).  Left side chest tube placed by CT surgery.  Assessment & Plan    Chest pain, SOB Mildly elevated Troponin Multivessel CAD Hemothorax   Patient presented with worsening of SOB and chest pain. EKG with ST elevation at V1, AVR and some ST depression at inferior lead but no significant elevation on Troponin trend.  Likely in setting of low perfusion, anemia 2/2 hemothorax.  Initially concerning for STEMI but multivessel CAD on heart cath was not associated with impaied  flow down to the stenosis and  is not suggestive of acute coronary syndrome as the cause of patients  symptoms.  Patient was initially started on DAP therapy and IV heparin therapy that soon iscontinued due to new finding of hemothorax on chest imaging. Other etiology such as PE (as patient was tachycardiac on arrival) was also on differential in beginning but further evaluation showed pulmonary opacity, ribs fracture and pneumothorax and shifted the management.   Patient symptoms improved after placing chest tube.  -Holding dual antiplatelet therapy and IV heparin due to hemothorax -Continue Crestor 20 mg daily (5 mg Crestor at home) -Continue metoprolol tartrate 12.5 mg twice daily (blood pressure stable at this point but will monitor) -Once more stable regarding pneumothorax, he will eventually need PCI versus CABG considering multivessel disease.  After aortic valve assessment.   -Holding home lisinopril, hydralazine and chlorthalidone in setting of soft BP on arrival -Follow up with primary cardiologist at [facility] after discharge to reevaluate likely plan for coronary artery intervention in next few weeks, w or w/o AV procedure.  Moderate arterial stenosis: He has been following with [facility] cardiology team.Echo yesterday showed peak gradient of 17 and mean value of 23.  -No sever AS and the plan will likely be elective valve replacement in future.   Left hemithorax Multiple rib fractures: Status post chest tube placement 10/05/2019 obtained 900 ml blood fluid. Secondary to mechanical fall 09/30/2019. He mentions that he was evaluated  By EMS for that but was not transferred to hospital. He was prescribed Prednisone and pain med by his primary but was not seen in person. Post chest tube chest x-ray showed stable left apical pneumothorax. No cutaneous emphysema on exam. Currently on 4 li O2 via [place]. Slightly tachycardic but otherwise VS stable.   -Appreciate CT surgery follow up    -Supplemental O2 to keep O2 sat more than 92%   AKI on CKD 3: Baseline Cr seems to be around 1.2-1.5. Cr now 2.13. Can be prerenal in setting of hemothorax and blood loss also IV contrast due to heart cath. Anticipating improvement -BMP daily -Avoiding nephrotoxic including contrast as much as possible  Leukocytosis: No fever or chills. No sign or symptoms of infection and denies any cough, chest pain, diarrhea, dysuria, fever or chills at home. Leukocytosis is trending down.  Was likely secondary to recent prednisone use.  -No further work-up at this point -We will monitor CBC   For questions or updates, please contact [place] Please consult [email or web address] for contact info under        Signed, Dewayne Hatch, MD  10/06/2019, 7:50 AM    I have personally seen and examined this patient. I agree with the assessment and plan as outlined above.   79 yo male with history of moderate AS, prior CVA on chronic Plavix, carotid artery disease and CAD who was admitted with dyspnea and chest pain on the night of 10/04/19. Cardiac cath with severe disease in the mid segment of the moderate caliber RCA and in the distal Circumflex. His presentation was not felt to be due to ACS, peak troponin 80. He was found to have a left hemothorax following a fall. He is now s/p chest tube placement per Dr. Servando Snare. His aortic stenosis is moderate by echo yesterday. LV systolic function is normal with no wall motion abnormalities.   His presentation is felt to be due to the hemothorax. Chest pain and dyspnea now resolved post drainage of hemothorax. He is off of ASA, Plavix and heparin due to the hemothorax. Stable today. BP stable. Sinus tach on tele. Labs reviewed by me.  I would anticipate that we will pursue medical management of his CAD at this time due to his bleeding risk on anti-platelet therapy. His aortic stenosis is moderate. His cardiologist is Dr. Mauricio Po at [place]. Will follow recs  of CT surgery regarding chest tube.   Lauree Chandler 10/06/2019 11:58 AM

## 2019-10-07 ENCOUNTER — Inpatient Hospital Stay (HOSPITAL_COMMUNITY): Payer: Medicare Other

## 2019-10-07 DIAGNOSIS — I25119 Atherosclerotic heart disease of native coronary artery with unspecified angina pectoris: Secondary | ICD-10-CM

## 2019-10-07 LAB — HEPATIC FUNCTION PANEL
ALT: 25 U/L (ref 0–44)
AST: 17 U/L (ref 15–41)
Albumin: 2.9 g/dL — ABNORMAL LOW (ref 3.5–5.0)
Alkaline Phosphatase: 59 U/L (ref 38–126)
Bilirubin, Direct: 0.2 mg/dL (ref 0.0–0.2)
Indirect Bilirubin: 1 mg/dL — ABNORMAL HIGH (ref 0.3–0.9)
Total Bilirubin: 1.2 mg/dL (ref 0.3–1.2)
Total Protein: 6 g/dL — ABNORMAL LOW (ref 6.5–8.1)

## 2019-10-07 LAB — BASIC METABOLIC PANEL
Anion gap: 13 (ref 5–15)
BUN: 55 mg/dL — ABNORMAL HIGH (ref 8–23)
CO2: 20 mmol/L — ABNORMAL LOW (ref 22–32)
Calcium: 8.6 mg/dL — ABNORMAL LOW (ref 8.9–10.3)
Chloride: 100 mmol/L (ref 98–111)
Creatinine, Ser: 1.88 mg/dL — ABNORMAL HIGH (ref 0.61–1.24)
GFR calc Af Amer: 39 mL/min — ABNORMAL LOW (ref >60)
GFR calc non Af Amer: 34 mL/min — ABNORMAL LOW (ref >60)
Glucose, Bld: 149 mg/dL — ABNORMAL HIGH (ref 70–99)
Potassium: 4 mmol/L (ref 3.5–5.1)
Sodium: 133 mmol/L — ABNORMAL LOW (ref 135–145)

## 2019-10-07 LAB — CBC
HCT: 28.3 % — ABNORMAL LOW (ref 39.0–52.0)
Hemoglobin: 9.4 g/dL — ABNORMAL LOW (ref 13.0–17.0)
MCH: 30.8 pg (ref 26.0–34.0)
MCHC: 33.2 g/dL (ref 30.0–36.0)
MCV: 92.8 fL (ref 80.0–100.0)
Platelets: 263 10*3/uL (ref 150–400)
RBC: 3.05 MIL/uL — ABNORMAL LOW (ref 4.22–5.81)
RDW: 12.9 % (ref 11.5–15.5)
WBC: 14.6 10*3/uL — ABNORMAL HIGH (ref 4.0–10.5)
nRBC: 0 % (ref 0.0–0.2)

## 2019-10-07 LAB — GLUCOSE, CAPILLARY
Glucose-Capillary: 119 mg/dL — ABNORMAL HIGH (ref 70–99)
Glucose-Capillary: 130 mg/dL — ABNORMAL HIGH (ref 70–99)
Glucose-Capillary: 149 mg/dL — ABNORMAL HIGH (ref 70–99)

## 2019-10-07 LAB — HEMOGLOBIN A1C
Hgb A1c MFr Bld: 6.8 % — ABNORMAL HIGH (ref 4.8–5.6)
Mean Plasma Glucose: 148.46 mg/dL

## 2019-10-07 LAB — LIPID PANEL
Cholesterol: 130 mg/dL (ref 0–200)
HDL: 27 mg/dL — ABNORMAL LOW (ref >40)
LDL Cholesterol: 52 mg/dL (ref 0–99)
Total CHOL/HDL Ratio: 4.8 RATIO
Triglycerides: 256 mg/dL — ABNORMAL HIGH (ref <150)
VLDL: 51 mg/dL — ABNORMAL HIGH (ref 0–40)

## 2019-10-07 MED ORDER — METOPROLOL TARTRATE 25 MG PO TABS
25.0000 mg | ORAL_TABLET | Freq: Two times a day (BID) | ORAL | Status: DC
  Administered 2019-10-07 – 2019-10-08 (×2): 25 mg via ORAL
  Filled 2019-10-07 (×2): qty 1

## 2019-10-07 MED ORDER — LACTULOSE 10 GM/15ML PO SOLN
20.0000 g | Freq: Once | ORAL | Status: DC
  Filled 2019-10-07: qty 30

## 2019-10-07 NOTE — Progress Notes (Signed)
Pulled chest tube per physician orders.  Tube was pulled without complication and intact. Gauze/Tape  Placed over area. Patient tolerated removal of chest tube well and resting in bed comfortably.

## 2019-10-07 NOTE — Progress Notes (Signed)
3 Days Post-Op Procedure(s) (LRB): LEFT HEART CATH AND CORONARY ANGIOGRAPHY (N/A) Subjective: Doesn't feel well this AM C/o pain at tube site  Objective: Vital signs in last 24 hours: Temp:  [97.7 F (36.5 C)-98.9 F (37.2 C)] 97.7 F (36.5 C) (02/20 0800) Pulse Rate:  [88-126] 111 (02/20 0800) Cardiac Rhythm: Sinus tachycardia (02/20 0800) Resp:  [18-23] 19 (02/20 0800) BP: (104-154)/(59-90) 140/74 (02/20 0800) SpO2:  [90 %-97 %] 91 % (02/20 0800)  Hemodynamic parameters for last 24 hours:    Intake/Output from previous day: 02/19 0701 - 02/20 0700 In: -  Out: 1215 [Urine:1175; Chest Tube:40] Intake/Output this shift: Total I/O In: -  Out: 220 [Urine:200; Chest Tube:20]  General appearance: alert, cooperative and no distress Neurologic: intact Heart: tachy, regular Lungs: diminished breath sounds bibasilar no air leak 3/6 systolic murmur  Lab Results: Recent Labs    10/06/19 0251 10/07/19 0203  WBC 13.3* 14.6*  HGB 9.3* 9.4*  HCT 27.7* 28.3*  PLT 235 263   BMET:  Recent Labs    10/06/19 0251 10/07/19 0203  NA 133* 133*  K 4.5 4.0  CL 99 100  CO2 22 20*  GLUCOSE 138* 149*  BUN 59* 55*  CREATININE 2.13* 1.88*  CALCIUM 8.4* 8.6*    PT/INR:  Recent Labs    10/04/19 2119  LABPROT 13.5  INR 1.0   ABG    Component Value Date/Time   TCO2 23 10/04/2019 2132   CBG (last 3)  Recent Labs    10/06/19 1619 10/06/19 2148 10/07/19 0608  GLUCAP 113* 126* 119*    Assessment/Plan: S/P Procedure(s) (LRB): LEFT HEART CATH AND CORONARY ANGIOGRAPHY (N/A) -Hemothorax - minimal CT output over past 24 hours and CXR stable  Will dc chest tube Requesting something for constipation- lactulose ordered   LOS: 3 days    Melrose Nakayama 10/07/2019

## 2019-10-07 NOTE — Progress Notes (Signed)
Progress Note  Patient Name: Scott Little Date of Encounter: 10/07/2019  Primary Cardiologist: Ellis Parents, MD  Subjective   Soreness at chest tube site, no obvious angina or breathlessness at rest.  No cough or palpitations.  Inpatient Medications    Scheduled Meds: . ALPRAZolam  0.5 mg Oral QHS  . Chlorhexidine Gluconate Cloth  6 each Topical Daily  . ezetimibe  10 mg Oral QHS  . insulin aspart  0-5 Units Subcutaneous QHS  . insulin aspart  0-9 Units Subcutaneous TID WC  . lactulose  20 g Oral Once  . metoprolol tartrate  12.5 mg Oral BID  . rosuvastatin  20 mg Oral Daily  . sodium chloride flush  3 mL Intravenous Q12H   Continuous Infusions: . sodium chloride     PRN Meds: sodium chloride, ALPRAZolam, fentaNYL (SUBLIMAZE) injection, ondansetron (ZOFRAN) IV, sodium chloride flush   Vital Signs    Vitals:   10/07/19 0500 10/07/19 0600 10/07/19 0700 10/07/19 0800  BP: 104/63 115/70 115/73 140/74  Pulse: (!) 108 (!) 110 (!) 109 (!) 111  Resp: (!) 21 (!) 21 (!) 22 19  Temp:    97.7 F (36.5 C)  TempSrc:    Oral  SpO2: 93% 94% 94% 91%  Weight:      Height:        Intake/Output Summary (Last 24 hours) at 10/07/2019 0955 Last data filed at 10/07/2019 0800 Gross per 24 hour  Intake --  Output 1435 ml  Net -1435 ml   Filed Weights   10/04/19 2122 10/06/19 0400  Weight: 75.3 kg 73.7 kg    Telemetry    Sinus rhythm and sinus tachycardia.  Personally reviewed.  Physical Exam   GEN:  Elderly male.  No acute distress.   Neck: No JVD. Cardiac: RRR, 3/6 systolic murmur, no gallop.  Thorax: Chest tube left side. Respiratory: Nonlabored.  Decreased breath sounds on the left mid to base. GI: Soft, nontender, bowel sounds present. MS: No edema; No deformity. Neuro:  Nonfocal. Psych: Alert and oriented x 3. Normal affect.  Labs    Chemistry Recent Labs  Lab 10/05/19 0238 10/06/19 0251 10/07/19 0203  NA 132* 133* 133*  K 4.2 4.5 4.0  CL 99 99  100  CO2 21* 22 20*  GLUCOSE 178* 138* 149*  BUN 49* 59* 55*  CREATININE 2.09* 2.13* 1.88*  CALCIUM 8.5* 8.4* 8.6*  PROT  --   --  6.0*  ALBUMIN  --   --  2.9*  AST  --   --  17  ALT  --   --  25  ALKPHOS  --   --  59  BILITOT  --   --  1.2  GFRNONAA 30* 29* 34*  GFRAA 34* 34* 39*  ANIONGAP 12 12 13      Hematology Recent Labs  Lab 10/05/19 0238 10/06/19 0251 10/07/19 0203  WBC 17.3* 13.3* 14.6*  RBC 3.26* 2.94* 3.05*  HGB 10.5* 9.3* 9.4*  HCT 30.4* 27.7* 28.3*  MCV 93.3 94.2 92.8  MCH 32.2 31.6 30.8  MCHC 34.5 33.6 33.2  RDW 13.1 13.2 12.9  PLT 297 235 263    Cardiac Enzymes Recent Labs  Lab 10/04/19 2119 10/04/19 2334 10/05/19 0238 10/05/19 0528  TROPONINIHS 75* 79* 83* 84*    BNP Recent Labs  Lab 10/04/19 2119  BNP 72.1     DDimer Recent Labs  Lab 10/04/19 2334  DDIMER 1.06*     Radiology  DG Chest 1 View  Result Date: 10/05/2019 CLINICAL DATA:  Shortness of breath and chest pain EXAM: CHEST  1 VIEW COMPARISON:  October 04, 2019 FINDINGS: There is a chest tube present on the left inferiorly. There is a minimal left apical pneumothorax. There is consolidation in the left base with suspected left pleural effusion. There is an ill-defined area of opacity in the right base at the level of the anterior right third rib. Heart size and pulmonary vascularity are normal. No adenopathy. There are displaced left sixth, seventh, and eighth rib fractures. IMPRESSION: Chest tube on the left with minimal left apical pneumothorax. Consolidation with suspected contusion in the left base with small left pleural effusion. There may also be pneumonia in this area. There are displaced rib fractures in this area. Ill-defined opacity at the level of the anterior right third rib. Particular attention this area on subsequent evaluations advised. Developing infiltrate in this area cannot be excluded. Stable cardiac silhouette.  Aortic Atherosclerosis (ICD10-I70.0).  Electronically Signed   By: Lowella Grip III M.D.   On: 10/05/2019 13:16   DG CHEST PORT 1 VIEW  Result Date: 10/07/2019 CLINICAL DATA:  Hemothorax on the left EXAM: PORTABLE CHEST 1 VIEW COMPARISON:  Yesterday FINDINGS: Left-sided rib fractures with pleural effusion and lower lobe opacification. A small left apical pneumothorax is unchanged. Stable chest tube positioning. IMPRESSION: Stable hemopneumothorax on the left. Electronically Signed   By: Monte Fantasia M.D.   On: 10/07/2019 06:31   DG Chest Port 1 View  Result Date: 10/06/2019 CLINICAL DATA:  Chest tube.  Shortness of breath. EXAM: PORTABLE CHEST 1 VIEW COMPARISON:  10/05/2019.  CT 10/05/2019. FINDINGS: Left chest tube in stable position. Stable tiny left apical pneumothorax. Mediastinum and hilar structures normal. Heart size normal. Persistent left base infiltrate and left-sided pleural effusion. Previously identified density noted over the anterior right third rib represents subsegmental atelectasis and is improved from prior exam. Displaced left rib fractures again noted. Postsurgical changes right shoulder. IMPRESSION: 1. Persistent left base atelectasis/infiltrate and left-sided pleural effusion. No interim change. 2. Previously identified density noted over the right anterior third rib represented subsegmental atelectasis. Interim improvement from prior exam. 3. Multiple left rib fractures again noted. Stable tiny left apical pneumothorax. Left chest tube in stable position. Electronically Signed   By: Marcello Moores  Register   On: 10/06/2019 07:47   ECHOCARDIOGRAM COMPLETE  Result Date: 10/05/2019    ECHOCARDIOGRAM REPORT   Patient Name:   Scott Little Date of Exam: 10/05/2019 Medical Rec #:  [id number]          Height:       67.0 in Accession #:    [id number]         Weight:       166.0 lb Date of Birth:  1941/03/25          BSA:          1.87 m Patient Age:    79 years           BP:           115/66 mmHg Patient Gender: M                   HR:           102 bpm. Exam Location:  Inpatient Procedure: 2D Echo Indications:    aortic stenosis 424.1  History:        Patient has no prior history of Echocardiogram examinations.  Sonographer:  Johny Chess Referring Phys: [place]  1. Left ventricular ejection fraction, by estimation, is 65 to 70%. The left ventricle has hyperdynamic function. The left ventricle has no regional wall motion abnormalities. There is mild left ventricular hypertrophy. Left ventricular diastolic parameters are consistent with Grade I diastolic dysfunction (impaired relaxation).  2. Right ventricular systolic function is normal. The right ventricular size is normal.  3. The mitral valve is normal in structure and function. No evidence of mitral valve regurgitation. No evidence of mitral stenosis.  4. The aortic valve is tricuspid. Aortic valve regurgitation is not visualized. Moderate aortic valve stenosis. Aortic valve area, by VTI measures 1.03 cm. Aortic valve mean gradient measures 22.0 mmHg.  5. The inferior vena cava is normal in size with greater than 50% respiratory variability, suggesting right atrial pressure of 3 mmHg. FINDINGS  Left Ventricle: Left ventricular ejection fraction, by estimation, is 65 to 70%. The left ventricle has hyperdynamic function. The left ventricle has no regional wall motion abnormalities. The left ventricular internal cavity size was normal in size. There is mild left ventricular hypertrophy. Left ventricular diastolic parameters are consistent with Grade I diastolic dysfunction (impaired relaxation). Right Ventricle: The right ventricular size is normal. No increase in right ventricular wall thickness. Right ventricular systolic function is normal. Left Atrium: Left atrial size was normal in size. Right Atrium: Right atrial size was normal in size. Pericardium: There is no evidence of pericardial effusion. Mitral Valve: The mitral valve is normal in  structure and function. No evidence of mitral valve regurgitation. No evidence of mitral valve stenosis. Tricuspid Valve: The tricuspid valve is normal in structure. Tricuspid valve regurgitation is not demonstrated. Aortic Valve: The aortic valve is tricuspid. Aortic valve regurgitation is not visualized. Moderate aortic stenosis is present. Aortic valve mean gradient measures 22.0 mmHg. Aortic valve peak gradient measures 45.0 mmHg. Aortic valve area, by VTI measures 1.03 cm. Pulmonic Valve: The pulmonic valve was normal in structure. Pulmonic valve regurgitation is not visualized. Aorta: The aortic root is normal in size and structure. Venous: The inferior vena cava is normal in size with greater than 50% respiratory variability, suggesting right atrial pressure of 3 mmHg. IAS/Shunts: No atrial level shunt detected by color flow Doppler.  LEFT VENTRICLE PLAX 2D LVIDd:         4.25 cm  Diastology LVIDs:         3.70 cm  LV e' lateral:   5.00 cm/s LV PW:         0.90 cm  LV E/e' lateral: 14.4 LV IVS:        1.10 cm  LV e' medial:    5.55 cm/s LVOT diam:     2.10 cm  LV E/e' medial:  13.0 LV SV:         54.90 ml LV SV Index:   11.94 LVOT Area:     3.46 cm  RIGHT VENTRICLE RV S prime:     10.80 cm/s LEFT ATRIUM             Index       RIGHT ATRIUM           Index LA diam:        3.40 cm 1.82 cm/m  RA Area:     12.90 cm LA Vol (A2C):   32.5 ml 17.40 ml/m RA Volume:   28.50 ml  15.25 ml/m LA Vol (A4C):   34.2 ml 18.31 ml/m LA Biplane Vol: 36.4  ml 19.48 ml/m  AORTIC VALVE AV Area (Vmax):    0.95 cm AV Area (Vmean):   0.92 cm AV Area (VTI):     1.03 cm AV Vmax:           335.50 cm/s AV Vmean:          208.000 cm/s AV VTI:            0.532 m AV Peak Grad:      45.0 mmHg AV Mean Grad:      22.0 mmHg LVOT Vmax:         92.05 cm/s LVOT Vmean:        55.200 cm/s LVOT VTI:          0.158 m LVOT/AV VTI ratio: 0.30  AORTA Ao Root diam: 3.10 cm MV E velocity: 72.00 cm/s MV A velocity: 128.00 cm/s  SHUNTS MV E/A ratio:   0.56         Systemic VTI:  0.16 m                             Systemic Diam: 2.10 cm Loralie Champagne MD Electronically signed by Loralie Champagne MD Signature Date/Time: 10/05/2019/2:43:59 PM    Final     Cardiac Studies   Cardiac catheterization 10/04/2019:  Prox RCA lesion is 90% stenosed.  RV Branch lesion is 90% stenosed.  RPDA lesion is 60% stenosed.  Dist Cx lesion is 99% stenosed.  2nd Mrg lesion is 70% stenosed.  Prox Cx lesion is 60% stenosed.  Ost LAD to Prox LAD lesion is 30% stenosed.  Mid LAD lesion is 40% stenosed.   1. Multi-vessel CAD 2. Mild to moderate LAD stenosis 3. Moderate proximal Circumflex stenosis. Severe distal Circumflex stenosis (co-dominant). Severe stenosis in the moderate caliber obtuse marginal branch 4. Severe stenosis in the mid RCA. The RV marginal branch has severe stenosis. The PDA has moderate stenosis.  5. Normal LVEDP 6. Moderate aortic stenosis by cath (mean gradient 28.3 mmHg, peak to peak gradient 17 mmHg)  Recommendations: He has severe, multi-vessel CAD. There is flow down all of the vessels. He also has at least moderate aortic stenosis. At this time, he has minimal chest pain. LV filling pressures are normal. No evidence of aortic dissection by aortic root angiography.   Will admit to the ICU. Will start a beta blocker and continue statin and ASA. I will resume IV heparin 4 hours post sheath pull. The differential includes aortic dissection that could not be seen on the aortic root angiography, PE and acute decompensation due to the combination of CAD and AS. Will arrange an echo in am. Will check a d-dimer tonight. Given renal insufficiency, will not perform CTA tonight to exclude dissection/PE. As above, heparin will be started. Cycle troponin.   Patient Profile     79 y.o. male with history of moderate aortic stenosis, hypertension, CVA, CKD stage III, and prostate cancer.  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  Cardiac issues are being managed medically at this time in the setting of left hemopneumothorax with multiple left-sided rib fractures, currently chest tube in place.  Assessment & Plan    1.  Multiple left-sided rib fractures after mechanical fall with hemopneumothorax.  Chest tube remains in place with management by TCTS.  Follow-up chest x-ray today shows stable hemopneumothorax.  2.  Severe multivessel CAD with normal LVEF and plan  for medical therapy at this time.  He is not on antiplatelet agent or anticoagulant in light of problem #1.  Currently on Lopressor, Crestor,  3.  Moderate aortic stenosis gradient 22 mmHg and dimensionless index 0.30.  4.  Acute on chronic renal insufficiency with CKD stage III at baseline.  Creatinine down to 1.88.  Potassium normal.  5.  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  Increase Lopressor to 25 mg twice daily, continue Crestor and Zetia.  Holding off on aspirin until hemopneumothorax stabilized and cleared to to do so by TCTS.  Signed, Rozann Lesches, MD  10/07/2019, 9:55 AM

## 2019-10-08 ENCOUNTER — Inpatient Hospital Stay (HOSPITAL_COMMUNITY): Payer: Medicare Other

## 2019-10-08 LAB — CBC
HCT: 27.1 % — ABNORMAL LOW (ref 39.0–52.0)
Hemoglobin: 9.4 g/dL — ABNORMAL LOW (ref 13.0–17.0)
MCH: 32 pg (ref 26.0–34.0)
MCHC: 34.7 g/dL (ref 30.0–36.0)
MCV: 92.2 fL (ref 80.0–100.0)
Platelets: 300 10*3/uL (ref 150–400)
RBC: 2.94 MIL/uL — ABNORMAL LOW (ref 4.22–5.81)
RDW: 13 % (ref 11.5–15.5)
WBC: 15.8 10*3/uL — ABNORMAL HIGH (ref 4.0–10.5)
nRBC: 0 % (ref 0.0–0.2)

## 2019-10-08 LAB — GLUCOSE, CAPILLARY
Glucose-Capillary: 107 mg/dL — ABNORMAL HIGH (ref 70–99)
Glucose-Capillary: 112 mg/dL — ABNORMAL HIGH (ref 70–99)
Glucose-Capillary: 128 mg/dL — ABNORMAL HIGH (ref 70–99)
Glucose-Capillary: 151 mg/dL — ABNORMAL HIGH (ref 70–99)
Glucose-Capillary: 154 mg/dL — ABNORMAL HIGH (ref 70–99)
Glucose-Capillary: 155 mg/dL — ABNORMAL HIGH (ref 70–99)
Glucose-Capillary: 214 mg/dL — ABNORMAL HIGH (ref 70–99)

## 2019-10-08 LAB — BASIC METABOLIC PANEL
Anion gap: 12 (ref 5–15)
BUN: 50 mg/dL — ABNORMAL HIGH (ref 8–23)
CO2: 22 mmol/L (ref 22–32)
Calcium: 8.6 mg/dL — ABNORMAL LOW (ref 8.9–10.3)
Chloride: 99 mmol/L (ref 98–111)
Creatinine, Ser: 1.65 mg/dL — ABNORMAL HIGH (ref 0.61–1.24)
GFR calc Af Amer: 46 mL/min — ABNORMAL LOW (ref >60)
GFR calc non Af Amer: 39 mL/min — ABNORMAL LOW (ref >60)
Glucose, Bld: 139 mg/dL — ABNORMAL HIGH (ref 70–99)
Potassium: 4.2 mmol/L (ref 3.5–5.1)
Sodium: 133 mmol/L — ABNORMAL LOW (ref 135–145)

## 2019-10-08 MED ORDER — METOPROLOL TARTRATE 25 MG PO TABS
37.5000 mg | ORAL_TABLET | Freq: Two times a day (BID) | ORAL | Status: DC
  Administered 2019-10-08 – 2019-10-09 (×2): 37.5 mg via ORAL
  Filled 2019-10-08 (×2): qty 1

## 2019-10-08 MED ORDER — PHENOL 1.4 % MT LIQD
1.0000 | OROMUCOSAL | Status: DC | PRN
  Filled 2019-10-08: qty 177

## 2019-10-08 MED ORDER — DEXTROSE 50 % IV SOLN
INTRAVENOUS | Status: AC
  Filled 2019-10-08: qty 50

## 2019-10-08 NOTE — Progress Notes (Signed)
4 Days Post-Op Procedure(s) (LRB): LEFT HEART CATH AND CORONARY ANGIOGRAPHY (N/A) Subjective: Feels better with tube out, no respiratory issues  Objective: Vital signs in last 24 hours: Temp:  [98 F (36.7 C)-98.4 F (36.9 C)] 98 F (36.7 C) (02/21 0400) Pulse Rate:  [95-140] 110 (02/21 0700) Cardiac Rhythm: Sinus tachycardia (02/21 0748) Resp:  [15-31] 19 (02/21 0700) BP: (112-153)/(68-101) 121/69 (02/21 0700) SpO2:  [93 %-98 %] 96 % (02/21 0700)  Hemodynamic parameters for last 24 hours:    Intake/Output from previous day: 02/20 0701 - 02/21 0700 In: 300 [P.O.:300] Out: 470 [Urine:450; Chest Tube:20] Intake/Output this shift: No intake/output data recorded.  General appearance: alert, cooperative and no distress Neurologic: intact Heart: regular rate and rhythm and 3/6 systlic murmur Lungs: diminished breath sounds left base  Lab Results: Recent Labs    10/07/19 0203 10/08/19 0104  WBC 14.6* 15.8*  HGB 9.4* 9.4*  HCT 28.3* 27.1*  PLT 263 300   BMET:  Recent Labs    10/07/19 0203 10/08/19 0104  NA 133* 133*  K 4.0 4.2  CL 100 99  CO2 20* 22  GLUCOSE 149* 139*  BUN 55* 50*  CREATININE 1.88* 1.65*  CALCIUM 8.6* 8.6*    PT/INR: No results for input(s): LABPROT, INR in the last 72 hours. ABG    Component Value Date/Time   TCO2 23 10/04/2019 2132   CBG (last 3)  Recent Labs    10/08/19 0014 10/08/19 0346 10/08/19 0745  GLUCAP 128* 151* 155*    Assessment/Plan: S/P Procedure(s) (LRB): LEFT HEART CATH AND CORONARY ANGIOGRAPHY (N/A) -  Chest tube removed yesterday CXR looks good this morning, still has a little residual atelectasis, effusion at left base Ok to restart ASA   LOS: 4 days    Melrose Nakayama 10/08/2019

## 2019-10-08 NOTE — Progress Notes (Signed)
Progress Note  Patient Name: Scott Little Date of Encounter: 10/08/2019  Primary Cardiologist: Ellis Parents, MD  Subjective   Sitting in bedside chair this morning.  States that he did not get much sleep last night.  Appetite is stable.  Thoracic pain better since removal of chest tube, but not resolved.  No obvious angina.  Inpatient Medications    Scheduled Meds: . ALPRAZolam  0.5 mg Oral QHS  . Chlorhexidine Gluconate Cloth  6 each Topical Daily  . dextrose      . ezetimibe  10 mg Oral QHS  . insulin aspart  0-5 Units Subcutaneous QHS  . insulin aspart  0-9 Units Subcutaneous TID WC  . lactulose  20 g Oral Once  . metoprolol tartrate  25 mg Oral BID  . rosuvastatin  20 mg Oral Daily  . sodium chloride flush  3 mL Intravenous Q12H   Continuous Infusions: . sodium chloride     PRN Meds: sodium chloride, ALPRAZolam, fentaNYL (SUBLIMAZE) injection, ondansetron (ZOFRAN) IV, sodium chloride flush   Vital Signs    Vitals:   10/08/19 0400 10/08/19 0500 10/08/19 0600 10/08/19 0700  BP: 121/68 135/80 121/72 121/69  Pulse: (!) 108 (!) 108 (!) 109 (!) 110  Resp: (!) 21 (!) 21 20 19   Temp: 98 F (36.7 C)     TempSrc: Oral     SpO2: 95% 94% 95% 96%  Weight:      Height:        Intake/Output Summary (Last 24 hours) at 10/08/2019 0825 Last data filed at 10/08/2019 0600 Gross per 24 hour  Intake 300 ml  Output 250 ml  Net 50 ml   Filed Weights   10/04/19 2122 10/06/19 0400  Weight: 75.3 kg 73.7 kg    Telemetry    Sinus rhythm and sinus tachycardia.  Personally reviewed.  Physical Exam   GEN:  Elderly male.  No acute distress.   Neck: No JVD. Cardiac:  RRR with 3/6 systolic murmur and no gallop. Respiratory: Nonlabored.  Decreased breath sounds on the left side as before with rhonchi. GI: Soft, nontender, bowel sounds present. MS:  No edema; No deformity. Neuro:  Nonfocal. Psych: Alert and oriented x 3. Normal affect.  Labs    Chemistry Recent  Labs  Lab 10/06/19 0251 10/07/19 0203 10/08/19 0104  NA 133* 133* 133*  K 4.5 4.0 4.2  CL 99 100 99  CO2 22 20* 22  GLUCOSE 138* 149* 139*  BUN 59* 55* 50*  CREATININE 2.13* 1.88* 1.65*  CALCIUM 8.4* 8.6* 8.6*  PROT  --  6.0*  --   ALBUMIN  --  2.9*  --   AST  --  17  --   ALT  --  25  --   ALKPHOS  --  59  --   BILITOT  --  1.2  --   GFRNONAA 29* 34* 39*  GFRAA 34* 39* 46*  ANIONGAP 12 13 12      Hematology Recent Labs  Lab 10/06/19 0251 10/07/19 0203 10/08/19 0104  WBC 13.3* 14.6* 15.8*  RBC 2.94* 3.05* 2.94*  HGB 9.3* 9.4* 9.4*  HCT 27.7* 28.3* 27.1*  MCV 94.2 92.8 92.2  MCH 31.6 30.8 32.0  MCHC 33.6 33.2 34.7  RDW 13.2 12.9 13.0  PLT 235 263 300    Cardiac Enzymes Recent Labs  Lab 10/04/19 2119 10/04/19 2334 10/05/19 0238 10/05/19 0528  TROPONINIHS 75* 79* 83* 84*    BNP Recent Labs  Lab 10/04/19 2119  BNP 72.1     DDimer Recent Labs  Lab 10/04/19 2334  DDIMER 1.06*     Radiology    DG Chest 1V REPEAT Same Day  Result Date: 10/07/2019 CLINICAL DATA:  Post chest tube removal. EXAM: CHEST - 1 VIEW SAME DAY COMPARISON:  10/07/2019 FINDINGS: Interval removal of left basilar chest tube. Lungs are adequately inflated with persistent opacification over the left base likely effusion with associated atelectasis. Infection in the left base is possible. Likely tiny left apical pneumothorax unchanged. Cardiomediastinal silhouette and remainder of the exam including multiple left rib fractures is unchanged. IMPRESSION: 1. Interval removal left basilar chest tube with stable left base opacification likely effusion with atelectasis. Infection in the left base is possible. 2. Findings suggesting stable persistent tiny left apical pneumothorax. Electronically Signed   By: Marin Olp M.D.   On: 10/07/2019 11:44   DG Chest Port 1 View  Result Date: 10/08/2019 CLINICAL DATA:  Chest pain after fall EXAM: PORTABLE CHEST 1 VIEW COMPARISON:  Yesterday FINDINGS:  Unchanged pleural fluid and pulmonary opacity on the left adjacent to rib fractures. Trace left apical pneumothorax is unchanged. The right lung is clear. Stable heart size. IMPRESSION: Stable pleural fluid and trace pneumothorax on the left. Electronically Signed   By: Monte Fantasia M.D.   On: 10/08/2019 06:04   DG CHEST PORT 1 VIEW  Result Date: 10/07/2019 CLINICAL DATA:  Hemothorax on the left EXAM: PORTABLE CHEST 1 VIEW COMPARISON:  Yesterday FINDINGS: Left-sided rib fractures with pleural effusion and lower lobe opacification. A small left apical pneumothorax is unchanged. Stable chest tube positioning. IMPRESSION: Stable hemopneumothorax on the left. Electronically Signed   By: Monte Fantasia M.D.   On: 10/07/2019 06:31    Cardiac Studies   Cardiac catheterization 10/04/2019:  Prox RCA lesion is 90% stenosed.  RV Branch lesion is 90% stenosed.  RPDA lesion is 60% stenosed.  Dist Cx lesion is 99% stenosed.  2nd Mrg lesion is 70% stenosed.  Prox Cx lesion is 60% stenosed.  Ost LAD to Prox LAD lesion is 30% stenosed.  Mid LAD lesion is 40% stenosed.   1. Multi-vessel CAD 2. Mild to moderate LAD stenosis 3. Moderate proximal Circumflex stenosis. Severe distal Circumflex stenosis (co-dominant). Severe stenosis in the moderate caliber obtuse marginal branch 4. Severe stenosis in the mid RCA. The RV marginal branch has severe stenosis. The PDA has moderate stenosis.  5. Normal LVEDP 6. Moderate aortic stenosis by cath (mean gradient 28.3 mmHg, peak to peak gradient 17 mmHg)  Recommendations: He has severe, multi-vessel CAD. There is flow down all of the vessels. He also has at least moderate aortic stenosis. At this time, he has minimal chest pain. LV filling pressures are normal. No evidence of aortic dissection by aortic root angiography.   Will admit to the ICU. Will start a beta blocker and continue statin and ASA. I will resume IV heparin 4 hours post sheath pull. The  differential includes aortic dissection that could not be seen on the aortic root angiography, PE and acute decompensation due to the combination of CAD and AS. Will arrange an echo in am. Will check a d-dimer tonight. Given renal insufficiency, will not perform CTA tonight to exclude dissection/PE. As above, heparin will be started. Cycle troponin.   Patient Profile     79 y.o. male with history of moderate aortic stenosis, hypertension, CVA, CKD stage III, and prostate cancer.  He has multivessel CAD by recent cardiac  catheterization as well as moderate aortic stenosis.  Cardiac issues are being managed medically at this time in the setting of left hemopneumothorax with multiple left-sided rib fractures, currently chest tube in place.  Assessment & Plan    1.  Multiple left-sided rib fractures after mechanical fall with hemopneumothorax.  Chest tube removed yesterday per TCTS.  Follow-up chest x-ray today shows stable pleural fluid on the left adjacent to rib fractures and trace left apical pneumothorax.  2.  Severe multivessel CAD with normal LVEF and plan for medical therapy at this time.  He is not on antiplatelet agent or anticoagulant in light of problem #1.  Currently on Lopressor, Zetia, and Crestor,  3.  Moderate aortic stenosis gradient 22 mmHg and dimensionless index 0.30.  4.  Acute on chronic renal insufficiency with CKD stage III at baseline.  Creatinine down to 1.65.  5.  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  Plan to transfer out to telemetry at this point.  Holding off on antiplatelet regimen until cleared to resume by TCTS in light of his hemopneumothorax.  Increase beta-blocker dose, continue Zetia and Crestor.  Gradually mobilize.  Follow-up CBC and BMET in a.m.  Signed, Rozann Lesches, MD  10/08/2019, 8:25 AM

## 2019-10-08 NOTE — Progress Notes (Signed)
HR increased to 158. Pt was using restroom.  Pt returned to bed. O2 placed on pt.  HR decreased to 120.

## 2019-10-09 ENCOUNTER — Inpatient Hospital Stay (HOSPITAL_COMMUNITY): Payer: Medicare Other

## 2019-10-09 LAB — CBC
HCT: 27.5 % — ABNORMAL LOW (ref 39.0–52.0)
Hemoglobin: 9.2 g/dL — ABNORMAL LOW (ref 13.0–17.0)
MCH: 31.5 pg (ref 26.0–34.0)
MCHC: 33.5 g/dL (ref 30.0–36.0)
MCV: 94.2 fL (ref 80.0–100.0)
Platelets: 347 10*3/uL (ref 150–400)
RBC: 2.92 MIL/uL — ABNORMAL LOW (ref 4.22–5.81)
RDW: 12.9 % (ref 11.5–15.5)
WBC: 16.5 10*3/uL — ABNORMAL HIGH (ref 4.0–10.5)
nRBC: 0 % (ref 0.0–0.2)

## 2019-10-09 LAB — BASIC METABOLIC PANEL
Anion gap: 12 (ref 5–15)
BUN: 45 mg/dL — ABNORMAL HIGH (ref 8–23)
CO2: 22 mmol/L (ref 22–32)
Calcium: 8.6 mg/dL — ABNORMAL LOW (ref 8.9–10.3)
Chloride: 99 mmol/L (ref 98–111)
Creatinine, Ser: 1.76 mg/dL — ABNORMAL HIGH (ref 0.61–1.24)
GFR calc Af Amer: 42 mL/min — ABNORMAL LOW (ref >60)
GFR calc non Af Amer: 36 mL/min — ABNORMAL LOW (ref >60)
Glucose, Bld: 132 mg/dL — ABNORMAL HIGH (ref 70–99)
Potassium: 4.5 mmol/L (ref 3.5–5.1)
Sodium: 133 mmol/L — ABNORMAL LOW (ref 135–145)

## 2019-10-09 LAB — GLUCOSE, CAPILLARY
Glucose-Capillary: 138 mg/dL — ABNORMAL HIGH (ref 70–99)
Glucose-Capillary: 106 mg/dL — ABNORMAL HIGH (ref 70–99)

## 2019-10-09 MED ORDER — METOPROLOL TARTRATE 37.5 MG PO TABS: 37.5000 mg | ORAL_TABLET | Freq: Two times a day (BID) | ORAL | 2 refills | Status: DC

## 2019-10-09 MED ORDER — ASPIRIN EC 81 MG PO TBEC
81.0000 mg | DELAYED_RELEASE_TABLET | Freq: Every day | ORAL | Status: DC
  Administered 2019-10-09: 08:00:00 81 mg via ORAL
  Filled 2019-10-09: qty 1

## 2019-10-09 MED ORDER — ROSUVASTATIN CALCIUM 20 MG PO TABS: 20.0000 mg | ORAL_TABLET | Freq: Every day | ORAL | 2 refills | Status: AC

## 2019-10-09 MED ORDER — ACETAMINOPHEN 500 MG PO TABS
1000.0000 mg | ORAL_TABLET | Freq: Four times a day (QID) | ORAL | Status: DC | PRN
  Administered 2019-10-09: 08:00:00 1000 mg via ORAL
  Filled 2019-10-09: qty 2

## 2019-10-09 NOTE — Progress Notes (Signed)
Progress Note  Patient Name: Scott Little Date of Encounter: 10/09/2019  Primary Cardiologist: Ellis Parents, MD   Subjective   Pt denies any substernal/anginal type pain. He continues to have rib chest pain with movement. No dyspnea at rest. He does note shortness of breath with activity.  He is hoping to go home today depending on the surgeon.   Inpatient Medications    Scheduled Meds: . ALPRAZolam  0.5 mg Oral QHS  . Chlorhexidine Gluconate Cloth  6 each Topical Daily  . ezetimibe  10 mg Oral QHS  . insulin aspart  0-5 Units Subcutaneous QHS  . insulin aspart  0-9 Units Subcutaneous TID WC  . lactulose  20 g Oral Once  . metoprolol tartrate  37.5 mg Oral BID  . rosuvastatin  20 mg Oral Daily  . sodium chloride flush  3 mL Intravenous Q12H   Continuous Infusions: . sodium chloride     PRN Meds: sodium chloride, ALPRAZolam, fentaNYL (SUBLIMAZE) injection, ondansetron (ZOFRAN) IV, phenol, sodium chloride flush   Vital Signs    Vitals:   10/08/19 2023 10/08/19 2100 10/09/19 0016 10/09/19 0421  BP: 139/73  117/67 113/68  Pulse: (!) 123 (!) 115 96 97  Resp: 18  18 16   Temp: 98.2 F (36.8 C)  98.3 F (36.8 C) 98.7 F (37.1 C)  TempSrc: Oral  Oral Oral  SpO2: 95%  99% 99%  Weight:    70.5 kg  Height:        Intake/Output Summary (Last 24 hours) at 10/09/2019 0735 Last data filed at 10/08/2019 2205 Gross per 24 hour  Intake 423 ml  Output 450 ml  Net -27 ml   Last 3 Weights 10/09/2019 10/08/2019 10/06/2019  Weight (lbs) 155 lb 6.4 oz 156 lb 3.2 oz 162 lb 7.7 oz  Weight (kg) 70.489 kg 70.852 kg 73.7 kg      Telemetry    ST in the 100's - Personally Reviewed  ECG    No new tracings for review  Physical Exam   GEN: No acute distress.   Neck: No JVD Cardiac: RRR, harsh 4/6 systolic murmur RUSB/LUSB Respiratory: Clear to auscultation bilaterally, diminished sounds in the left base. GI: Soft, nontender, non-distended  MS: No edema; No  deformity. Neuro:  Nonfocal  Psych: Normal affect   Labs    High Sensitivity Troponin:   Recent Labs  Lab 10/04/19 2119 10/04/19 2334 10/05/19 0238 10/05/19 0528  TROPONINIHS 75* 79* 83* 84*      Chemistry Recent Labs  Lab 10/07/19 0203 10/08/19 0104 10/09/19 0341  NA 133* 133* 133*  K 4.0 4.2 4.5  CL 100 99 99  CO2 20* 22 22  GLUCOSE 149* 139* 132*  BUN 55* 50* 45*  CREATININE 1.88* 1.65* 1.76*  CALCIUM 8.6* 8.6* 8.6*  PROT 6.0*  --   --   ALBUMIN 2.9*  --   --   AST 17  --   --   ALT 25  --   --   ALKPHOS 59  --   --   BILITOT 1.2  --   --   GFRNONAA 34* 39* 36*  GFRAA 39* 46* 42*  ANIONGAP 13 12 12      Hematology Recent Labs  Lab 10/07/19 0203 10/08/19 0104 10/09/19 0341  WBC 14.6* 15.8* 16.5*  RBC 3.05* 2.94* 2.92*  HGB 9.4* 9.4* 9.2*  HCT 28.3* 27.1* 27.5*  MCV 92.8 92.2 94.2  MCH 30.8 32.0 31.5  MCHC 33.2 34.7  33.5  RDW 12.9 13.0 12.9  PLT 263 300 347    BNP Recent Labs  Lab 10/04/19 2119  BNP 72.1     DDimer  Recent Labs  Lab 10/04/19 2334  DDIMER 1.06*     Radiology    DG Chest 1V REPEAT Same Day  Result Date: 10/07/2019 CLINICAL DATA:  Post chest tube removal. EXAM: CHEST - 1 VIEW SAME DAY COMPARISON:  10/07/2019 FINDINGS: Interval removal of left basilar chest tube. Lungs are adequately inflated with persistent opacification over the left base likely effusion with associated atelectasis. Infection in the left base is possible. Likely tiny left apical pneumothorax unchanged. Cardiomediastinal silhouette and remainder of the exam including multiple left rib fractures is unchanged. IMPRESSION: 1. Interval removal left basilar chest tube with stable left base opacification likely effusion with atelectasis. Infection in the left base is possible. 2. Findings suggesting stable persistent tiny left apical pneumothorax. Electronically Signed   By: Marin Olp M.D.   On: 10/07/2019 11:44   DG Chest Port 1 View  Result Date:  10/08/2019 CLINICAL DATA:  Chest pain after fall EXAM: PORTABLE CHEST 1 VIEW COMPARISON:  Yesterday FINDINGS: Unchanged pleural fluid and pulmonary opacity on the left adjacent to rib fractures. Trace left apical pneumothorax is unchanged. The right lung is clear. Stable heart size. IMPRESSION: Stable pleural fluid and trace pneumothorax on the left. Electronically Signed   By: Monte Fantasia M.D.   On: 10/08/2019 06:04    Cardiac Studies   Left heart cath 10/04/2019  Prox RCA lesion is 90% stenosed.  RV Branch lesion is 90% stenosed.  RPDA lesion is 60% stenosed.  Dist Cx lesion is 99% stenosed.  2nd Mrg lesion is 70% stenosed.  Prox Cx lesion is 60% stenosed.  Ost LAD to Prox LAD lesion is 30% stenosed.  Mid LAD lesion is 40% stenosed.  1. Multi-vessel CAD 2. Mild to moderate LAD stenosis 3. Moderate proximal Circumflex stenosis. Severe distal Circumflex stenosis (co-dominant). Severe stenosis in the moderate caliber obtuse marginal branch 4. Severe stenosis in the mid RCA. The RV marginal branch has severe stenosis. The PDA has moderate stenosis.  5. Normal LVEDP 6. Moderate aortic stenosis by cath (mean gradient 28.3 mmHg, peak to peak gradient 17 mmHg)  Recommendations: He has severe, multi-vessel CAD. There is flow down all of the vessels. He also has at least moderate aortic stenosis. At this time, he has minimal chest pain. LV filling pressures are normal. No evidence of aortic dissection by aortic root angiography.   Will admit to the ICU. Will start a beta blocker and continue statin and ASA. I will resume IV heparin 4 hours post sheath pull. The differential includes aortic dissection that could not be seen on the aortic root angiography, PE and acute decompensation due to the combination of CAD and AS. Will arrange an echo in am. Will check a d-dimer tonight. Given renal insufficiency, will not perform CTA tonight to exclude dissection/PE. As above, heparin will be  started. Cycle troponin.   Diagnostic Dominance: Co-dominant      Echo 10/05/2019:  1. Left ventricular ejection fraction, by estimation, is 65 to 70%. The  left ventricle has hyperdynamic function. The left ventricle has no  regional wall motion abnormalities. There is mild left ventricular  hypertrophy. Left ventricular diastolic  parameters are consistent with Grade I diastolic dysfunction (impaired  relaxation).  2. Right ventricular systolic function is normal. The right ventricular  size is normal.  3. The mitral  valve is normal in structure and function. No evidence of  mitral valve regurgitation. No evidence of mitral stenosis.  4. The aortic valve is tricuspid. Aortic valve regurgitation is not  visualized. Moderate aortic valve stenosis. Aortic valve area, by VTI  measures 1.03 cm. Aortic valve mean gradient measures 22.0 mmHg.  5. The inferior vena cava is normal in size with greater than 50%  respiratory variability, suggesting right atrial pressure of 3 mmHg.     Patient Profile     79 y.o. male with past medical history of moderate aortic stenosis, hypertension, CVA, CKD stage III, history of prostate cancer who presented with pressure-like 10 out of 10 substernal chest pain at rest associated with worsening of shortness of breath and diaphoresis.  He had new EKG changes with mild elevation in troponin.  Concerning for STEMI, he was sent to cardiac cath and found to have multivessel disease but with good down flow and decided to manage medically. He was hypotensive and tachycardic meanwhile, found to have left hemothorax and multiple left ribs fracture (with recent history of mechanical fall on stairs and chest trauma).  Left side chest tube placed by CT surgery, now discontinued.  Assessment & Plan    CAD/NSTEMI -Cardiac cath showed multivessel disease with severe mid RCA and distal circumflex stenosis but flow down all vessels.  Troponin was mildly  elevated, peak of 80.  His presentation was not felt to be due to ACS, but more to finding of hemothorax related to a fall. -Patient will need to be considered for possible PCI versus CABG at some point.  Current plan appears to be medical management of his CAD at this time due to his bleeding risk on antiplatelet therapy.  Patient will need to follow-up with his primary cardiologist at [facility]. -CT surgery okay to resume aspirin.  Continues on statin and beta-blocker. -Patient continues to be tachycardic with heart rate in the low 100s.  Left hemothorax related to a mechanical fall -Chest tube was placed by cardiothoracic surgery on 10/05/2019 with resulting 900 mL of bloody fluid.  Chest tube removed on 2/20.  -Patient continues on oxygen at 1 L per nasal cannula. -Hemoglobin is stable at 9.2. -Chest x-ray done this morning pending. -Possible discharge today pending CT surgery recommendation and Dr. Debara Pickett.   Aortic stenosis -Moderate by echo on 10/05/2019 with mean gradient of 22 mmHg.  LV systolic function is normal with no wall motion abnormalities.  AKI on CKD stage III -Baseline serum creatinine~1.2-1.5. -SCr 1.94 on admission, up to 2.13.  Creatinine 1.76 today, slightly increased from 1.65 yesterday.  -Continue to monitor serum creatinine -Avoiding nephrotoxic agents as much as possible.  Hypertension -Lisinopril, hydralazine and chlorthalidone have been held in the setting of soft blood pressure. -Blood pressure currently stable and well controlled.  Hyperlipidemia, LDL goal <70 -On rosuvastatin 20 mg and Zetia 10 mg -LDL is 52.  Continue current therapy.  For questions or updates, please contact [place] Please consult [email or web address] for contact info under        Signed, Daune Perch, NP  10/09/2019, 7:35 AM

## 2019-10-09 NOTE — Plan of Care (Signed)
  Problem: Education: Goal: Knowledge of General Education information will improve Description: Including pain rating scale, medication(s)/side effects and non-pharmacologic comfort measures Outcome: Progressing   Problem: Health Behavior/Discharge Planning: Goal: Ability to manage health-related needs will improve Outcome: Progressing   Problem: Clinical Measurements: Goal: Ability to maintain clinical measurements within normal limits will improve Outcome: Progressing Goal: Will remain free from infection Outcome: Progressing Goal: Diagnostic test results will improve Outcome: Progressing Goal: Respiratory complications will improve Outcome: Progressing Goal: Cardiovascular complication will be avoided Outcome: Progressing   Problem: Activity: Goal: Risk for activity intolerance will decrease Outcome: Progressing   Problem: Nutrition: Goal: Adequate nutrition will be maintained Outcome: Progressing   Problem: Coping: Goal: Level of anxiety will decrease Outcome: Progressing   Problem: Elimination: Goal: Will not experience complications related to bowel motility Outcome: Progressing Goal: Will not experience complications related to urinary retention Outcome: Progressing   Problem: Pain Managment: Goal: General experience of comfort will improve Outcome: Progressing   Problem: Safety: Goal: Ability to remain free from injury will improve Outcome: Progressing   Problem: Skin Integrity: Goal: Risk for impaired skin integrity will decrease Outcome: Progressing   Problem: Education: Goal: Understanding of cardiac disease, CV risk reduction, and recovery process will improve Outcome: Progressing Goal: Individualized Educational Video(s) Outcome: Progressing   Problem: Cardiac: Goal: Ability to achieve and maintain adequate cardiovascular perfusion will improve Outcome: Progressing   Problem: Health Behavior/Discharge Planning: Goal: Ability to safely manage  health-related needs after discharge will improve Outcome: Progressing

## 2019-10-09 NOTE — Progress Notes (Signed)
SATURATION QUALIFICATIONS: (This note is used to comply with regulatory documentation for home oxygen)  Patient Saturations on Room Air at Rest = 99%   Patient Saturations on Room Air while Ambulating = 92%   Patient Saturations on 0 Liters of oxygen while Ambulating = 97%  Please briefly explain why patient needs home oxygen:Does not need home o2 oxygen reports ribs sore from previous chest tube and hurt while ambulating effects breathing some hence his o2 sats do drop but reports when he rests soreness goes away reports prn tylenol effective for pain. So o2 sats range 92-94 while ambulating on room air.

## 2019-10-09 NOTE — Progress Notes (Signed)
      [address]       [place] [address]             [phone]       5 Days Post-Op Procedure(s) (LRB): LEFT HEART CATH AND CORONARY ANGIOGRAPHY (N/A)  Subjective: Patient asked why his oxygen levels decrease when he walks. We discussed the multifactorial reasons for this but that his lung was completely re expanded.  Objective: Vital signs in last 24 hours: Temp:  [97.8 F (36.6 C)-98.9 F (37.2 C)] 98.7 F (37.1 C) (02/22 0421) Pulse Rate:  [90-123] 97 (02/22 0421) Cardiac Rhythm: Sinus tachycardia (02/21 1900) Resp:  [16-21] 16 (02/22 0421) BP: (113-168)/(67-85) 113/68 (02/22 0421) SpO2:  [95 %-99 %] 99 % (02/22 0421) Weight:  [70.5 kg-70.9 kg] 70.5 kg (02/22 0421)      Intake/Output from previous day: 02/21 0701 - 02/22 0700 In: 423 [P.O.:420; I.V.:3] Out: 450 [Urine:450]   Physical Exam:  Cardiovascular: Slightly tachycardic, grade III/VI  Systolic murmur Pulmonary: Clear to auscultation on right and diminished at left base Wound: Clean and dry.  No active drainage at chest tube wound this am   Lab Results: CBC: Recent Labs    10/08/19 0104 10/09/19 0341  WBC 15.8* 16.5*  HGB 9.4* 9.2*  HCT 27.1* 27.5*  PLT 300 347   BMET:  Recent Labs    10/08/19 0104 10/09/19 0341  NA 133* 133*  K 4.2 4.5  CL 99 99  CO2 22 22  GLUCOSE 139* 132*  BUN 50* 45*  CREATININE 1.65* 1.76*  CALCIUM 8.6* 8.6*    PT/INR: No results for input(s): LABPROT, INR in the last 72 hours. ABG:  INR: Will add last result for INR, ABG once components are confirmed Will add last 4 CBG results once components are confirmed  Assessment/Plan:  1. CV - S/p NSTEMI. Has CAD and aortic stenosis. ST with HR in the low 100's. On Lopressor 37.5 mg bid. Per Cardiology 2.  Pulmonary - Chest tube removed 02/20. On 1 L of oxygen via [place].  3. CKD (stage III)-creatinine this am slightly increased to 1.76.  Chinmayi Rumer M ZimmermanPA-C 10/09/2019,7:18  AM [phone]

## 2019-10-09 NOTE — Plan of Care (Signed)
Problem: Education: Goal: Knowledge of General Education information will improve Description: Including pain rating scale, medication(s)/side effects and non-pharmacologic comfort measures 10/09/2019 1406 by Arlina Robes, RN Outcome: Adequate for Discharge 10/09/2019 1236 by Arlina Robes, RN Outcome: Progressing   Problem: Health Behavior/Discharge Planning: Goal: Ability to manage health-related needs will improve 10/09/2019 1406 by Arlina Robes, RN Outcome: Adequate for Discharge 10/09/2019 1236 by Arlina Robes, RN Outcome: Progressing   Problem: Clinical Measurements: Goal: Ability to maintain clinical measurements within normal limits will improve 10/09/2019 1406 by Arlina Robes, RN Outcome: Adequate for Discharge 10/09/2019 1236 by Arlina Robes, RN Outcome: Progressing Goal: Will remain free from infection 10/09/2019 1406 by Arlina Robes, RN Outcome: Adequate for Discharge 10/09/2019 1236 by Arlina Robes, RN Outcome: Progressing Goal: Diagnostic test results will improve 10/09/2019 1406 by Arlina Robes, RN Outcome: Adequate for Discharge 10/09/2019 1236 by Arlina Robes, RN Outcome: Progressing Goal: Respiratory complications will improve 10/09/2019 1406 by Arlina Robes, RN Outcome: Adequate for Discharge 10/09/2019 1236 by Arlina Robes, RN Outcome: Progressing Goal: Cardiovascular complication will be avoided 10/09/2019 1406 by Arlina Robes, RN Outcome: Adequate for Discharge 10/09/2019 1236 by Arlina Robes, RN Outcome: Progressing   Problem: Clinical Measurements: Goal: Ability to maintain clinical measurements within normal limits will improve 10/09/2019 1406 by Arlina Robes, RN Outcome: Adequate for Discharge 10/09/2019 1236 by Arlina Robes, RN Outcome: Progressing Goal: Will remain free from infection 10/09/2019 1406 by Arlina Robes, RN Outcome: Adequate for  Discharge 10/09/2019 1236 by Arlina Robes, RN Outcome: Progressing Goal: Diagnostic test results will improve 10/09/2019 1406 by Arlina Robes, RN Outcome: Adequate for Discharge 10/09/2019 1236 by Arlina Robes, RN Outcome: Progressing Goal: Respiratory complications will improve 10/09/2019 1406 by Arlina Robes, RN Outcome: Adequate for Discharge 10/09/2019 1236 by Arlina Robes, RN Outcome: Progressing Goal: Cardiovascular complication will be avoided 10/09/2019 1406 by Arlina Robes, RN Outcome: Adequate for Discharge 10/09/2019 1236 by Arlina Robes, RN Outcome: Progressing   Problem: Activity: Goal: Risk for activity intolerance will decrease 10/09/2019 1406 by Arlina Robes, RN Outcome: Adequate for Discharge 10/09/2019 1236 by Arlina Robes, RN Outcome: Progressing   Problem: Activity: Goal: Risk for activity intolerance will decrease 10/09/2019 1406 by Arlina Robes, RN Outcome: Adequate for Discharge 10/09/2019 1236 by Arlina Robes, RN Outcome: Progressing   Problem: Nutrition: Goal: Adequate nutrition will be maintained 10/09/2019 1406 by Arlina Robes, RN Outcome: Adequate for Discharge 10/09/2019 1236 by Arlina Robes, RN Outcome: Progressing   Problem: Coping: Goal: Level of anxiety will decrease 10/09/2019 1406 by Arlina Robes, RN Outcome: Adequate for Discharge 10/09/2019 1236 by Arlina Robes, RN Outcome: Progressing   Problem: Elimination: Goal: Will not experience complications related to bowel motility 10/09/2019 1406 by Arlina Robes, RN Outcome: Adequate for Discharge 10/09/2019 1236 by Arlina Robes, RN Outcome: Progressing Goal: Will not experience complications related to urinary retention 10/09/2019 1406 by Arlina Robes, RN Outcome: Adequate for Discharge 10/09/2019 1236 by Arlina Robes, RN Outcome: Progressing   Problem: Elimination: Goal: Will not  experience complications related to bowel motility 10/09/2019 1406 by Arlina Robes, RN Outcome: Adequate for Discharge 10/09/2019 1236 by Arlina Robes, RN Outcome: Progressing Goal: Will not experience complications related to urinary retention 10/09/2019 1406 by Arlina Robes, RN Outcome: Adequate for Discharge 10/09/2019 1236 by Arlina Robes, RN Outcome:  Progressing   Problem: Pain Managment: Goal: General experience of comfort will improve 10/09/2019 1406 by Arlina Robes, RN Outcome: Adequate for Discharge 10/09/2019 1236 by Arlina Robes, RN Outcome: Progressing   Problem: Skin Integrity: Goal: Risk for impaired skin integrity will decrease 10/09/2019 1406 by Arlina Robes, RN Outcome: Adequate for Discharge 10/09/2019 1236 by Arlina Robes, RN Outcome: Progressing   Problem: Safety: Goal: Ability to remain free from injury will improve 10/09/2019 1406 by Arlina Robes, RN Outcome: Adequate for Discharge 10/09/2019 1236 by Arlina Robes, RN Outcome: Progressing   Problem: Education: Goal: Understanding of cardiac disease, CV risk reduction, and recovery process will improve 10/09/2019 1406 by Arlina Robes, RN Outcome: Adequate for Discharge 10/09/2019 1236 by Arlina Robes, RN Outcome: Progressing Goal: Individualized Educational Video(s) 10/09/2019 1406 by Arlina Robes, RN Outcome: Adequate for Discharge 10/09/2019 1236 by Arlina Robes, RN Outcome: Progressing   Problem: Activity: Goal: Ability to tolerate increased activity will improve 10/09/2019 1406 by Arlina Robes, RN Outcome: Adequate for Discharge 10/09/2019 1236 by Arlina Robes, RN Outcome: Progressing   Problem: Cardiac: Goal: Ability to achieve and maintain adequate cardiovascular perfusion will improve 10/09/2019 1406 by Arlina Robes, RN Outcome: Adequate for Discharge 10/09/2019 1236 by Arlina Robes, RN Outcome:  Progressing   Problem: Health Behavior/Discharge Planning: Goal: Ability to safely manage health-related needs after discharge will improve 10/09/2019 1406 by Arlina Robes, RN Outcome: Adequate for Discharge 10/09/2019 1236 by Arlina Robes, RN Outcome: Progressing

## 2019-10-09 NOTE — Discharge Summary (Signed)
Discharge Summary    Patient ID: Scott Little MRN: [id number]; DOB: [birth year]  Admit date: 10/04/2019 Discharge date: 10/09/2019  Primary Care Provider: Wayland Salinas, MD  Primary Cardiologist: Ellis Parents, MD  Primary Electrophysiologist:  None   Discharge Diagnoses    Active Problems:   Non-ST elevation (NSTEMI) myocardial infarction [facility])   NSTEMI (non-ST elevated myocardial infarction) [facility])   Hemothorax on left   Elevated troponin   Coronary artery disease involving native coronary artery of native heart without angina pectoris   Moderate aortic stenosis    Diagnostic Studies/Procedures    Left heart cath 10/04/2019  Prox RCA lesion is 90% stenosed.  RV Branch lesion is 90% stenosed.  RPDA lesion is 60% stenosed.  Dist Cx lesion is 99% stenosed.  2nd Mrg lesion is 70% stenosed.  Prox Cx lesion is 60% stenosed.  Ost LAD to Prox LAD lesion is 30% stenosed.  Mid LAD lesion is 40% stenosed.  1. Multi-vessel CAD 2. Mild to moderate LAD stenosis 3. Moderate proximal Circumflex stenosis. Severe distal Circumflex stenosis (co-dominant). Severe stenosis in the moderate caliber obtuse marginal branch 4. Severe stenosis in the mid RCA. The RV marginal branch has severe stenosis. The PDA has moderate stenosis.  5. Normal LVEDP 6. Moderate aortic stenosis by cath (mean gradient 28.3 mmHg, peak to peak gradient 17 mmHg)  Recommendations: He has severe, multi-vessel CAD. There is flow down all of the vessels. He also has at least moderate aortic stenosis. At this time, he has minimal chest pain. LV filling pressures are normal. No evidence of aortic dissection by aortic root angiography.   Will admit to the ICU. Will start a beta blocker and continue statin and ASA. I will resume IV heparin 4 hours post sheath pull. The differential includes aortic dissection that could not be seen on the aortic root angiography, PE and acute decompensation due to the  combination of CAD and AS. Will arrange an echo in am. Will check a d-dimer tonight. Given renal insufficiency, will not perform CTA tonight to exclude dissection/PE. As above, heparin will be started. Cycle troponin.   Diagnostic Dominance: Co-dominant     Echo 10/05/2019:  1. Left ventricular ejection fraction, by estimation, is 65 to 70%. The  left ventricle has hyperdynamic function. The left ventricle has no  regional wall motion abnormalities. There is mild left ventricular  hypertrophy. Left ventricular diastolic  parameters are consistent with Grade I diastolic dysfunction (impaired  relaxation).  2. Right ventricular systolic function is normal. The right ventricular  size is normal.  3. The mitral valve is normal in structure and function. No evidence of  mitral valve regurgitation. No evidence of mitral stenosis.  4. The aortic valve is tricuspid. Aortic valve regurgitation is not  visualized. Moderate aortic valve stenosis. Aortic valve area, by VTI  measures 1.03 cm. Aortic valve mean gradient measures 22.0 mmHg.  5. The inferior vena cava is normal in size with greater than 50%  respiratory variability, suggesting right atrial pressure of 3 mmHg.    _____________   History of Present Illness     Scott Little is a 79 y.o. male with past medical history of moderate aortic stenosis, hypertension, CVA, CKD stage III, history of prostate cancer who presented to the hospital on 10/04/2019 with pressure-like 10 out of 10 substernal chest pain at rest associated with worsening of shortness of breath and diaphoresis. He had new EKG changes with mild elevation in troponin. He is followed  at [facility] cardiology for aortic stenosis. The patient mentioned exertional dyspnea to his cardiologist when he was seen in Oct 2020. TTE was repeated at that time which showed AS to still be in the moderate range. He was due for repeat TTE again on 12-11-19.   Hospital Course      Consultants: Cardiothoracic surgery  Concerning for STEMI, he was sent to cardiac cath and found to have multivessel disease but with good down flow and decided to manage medically. He was hypotensive and tachycardicmeanwhile,found to have left hemothorax andmultiple left ribs fracture (with recent history of mechanical fall on stairs and chest trauma).  CAD/NSTEMI -Cardiac cath showed multivessel disease with severe mid RCA and distal circumflex stenosis but flow down all vessels.  Troponin was mildly elevated, peak of 80.  His presentation was not felt to be due to ACS, but more to finding of hemothorax related to a fall. -Patient will need to be considered for possible PCI versus CABG at some point.  Current plan is for medical management of his CAD at this time due to his bleeding risk on antiplatelet therapy.  Patient will need to follow-up with his primary cardiologist at [facility]. He says that he will call today and arrange.  -CT surgery okay to resume aspirin.  Continues on statin and beta-blocker. -Patient continues to be tachycardic with heart rate in the low 100s.  Left hemothorax related to a mechanical fall -Chest tube was placed by cardiothoracic surgery on 10/05/2019 with resulting 900 mL of bloody fluid.  Chest tube removed on 2/20.  -Hemoglobin is stable at 9.2. -Chest x-ray done this morning, stable. -Stable for discharge. Follow up with [facility].  -Chest tube suture should be removed 10-12 days after chest tube removal (around 10/16/19)  Aortic stenosis -Moderate by echo on 10/05/2019 with mean gradient of 22 mmHg.  LV systolic function is normal with no wall motion abnormalities. -Follow up with his primary cardiologist.   AKI on CKD stage III -Baseline serum creatinine~1.2-1.5. -SCr 1.94 on admission, up to 2.13.  Creatinine 1.76 today, slightly increased from 1.65 yesterday.  -Continue to monitor serum creatinine -Avoiding nephrotoxic agents as much as  possible.  Hypertension -Lisinopril, hydralazine and chlorthalidone have been held in the setting of soft blood pressure. -Blood pressure currently stable and well controlled. -Follow up with PCP and his cardiologist post hospital.  Hyperlipidemia, LDL goal <70 -On rosuvastatin 20 mg and Zetia 10 mg -LDL is 52.  Continue current therapy.   Did the patient have an acute coronary syndrome (MI, NSTEMI, STEMI, etc) this admission?:  No.   The elevated Troponin was due to the acute medical illness (demand ischemia).  _____________  Discharge Vitals Blood pressure 117/68, pulse 89, temperature (!) 97.5 F (36.4 C), temperature source Oral, resp. rate 18, height 5\' 7"  (1.702 m), weight 70.5 kg, SpO2 97 %.  Filed Weights   10/06/19 0400 10/08/19 1510 10/09/19 0421  Weight: 73.7 kg 70.9 kg 70.5 kg    Labs & Radiologic Studies    CBC Recent Labs    10/08/19 0104 10/09/19 0341  WBC 15.8* 16.5*  HGB 9.4* 9.2*  HCT 27.1* 27.5*  MCV 92.2 94.2  PLT 300 [id number]   Basic Metabolic Panel Recent Labs    10/08/19 0104 10/09/19 0341  NA 133* 133*  K 4.2 4.5  CL 99 99  CO2 22 22  GLUCOSE 139* 132*  BUN 50* 45*  CREATININE 1.65* 1.76*  CALCIUM 8.6* 8.6*   Liver Function Tests  Recent Labs    10/07/19 0203  AST 17  ALT 25  ALKPHOS 59  BILITOT 1.2  PROT 6.0*  ALBUMIN 2.9*   No results for input(s): LIPASE, AMYLASE in the last 72 hours. High Sensitivity Troponin:   Recent Labs  Lab 10/04/19 2119 10/04/19 2334 10/05/19 0238 10/05/19 0528  TROPONINIHS 75* 79* 83* 84*    BNP Invalid input(s): POCBNP D-Dimer No results for input(s): DDIMER in the last 72 hours. Hemoglobin A1C Recent Labs    10/07/19 0203  HGBA1C 6.8*   Fasting Lipid Panel Recent Labs    10/07/19 0203  CHOL 130  HDL 27*  LDLCALC 52  TRIG 256*  CHOLHDL 4.8   Thyroid Function Tests No results for input(s): TSH, T4TOTAL, T3FREE, THYROIDAB in the last 72 hours.  Invalid input(s):  FREET3 _____________  DG Chest 1 View  Result Date: 10/05/2019 CLINICAL DATA:  Shortness of breath and chest pain EXAM: CHEST  1 VIEW COMPARISON:  October 04, 2019 FINDINGS: There is a chest tube present on the left inferiorly. There is a minimal left apical pneumothorax. There is consolidation in the left base with suspected left pleural effusion. There is an ill-defined area of opacity in the right base at the level of the anterior right third rib. Heart size and pulmonary vascularity are normal. No adenopathy. There are displaced left sixth, seventh, and eighth rib fractures. IMPRESSION: Chest tube on the left with minimal left apical pneumothorax. Consolidation with suspected contusion in the left base with small left pleural effusion. There may also be pneumonia in this area. There are displaced rib fractures in this area. Ill-defined opacity at the level of the anterior right third rib. Particular attention this area on subsequent evaluations advised. Developing infiltrate in this area cannot be excluded. Stable cardiac silhouette.  Aortic Atherosclerosis (ICD10-I70.0). Electronically Signed   By: Lowella Grip III M.D.   On: 10/05/2019 13:16   DG Chest 2 View  Result Date: 10/09/2019 CLINICAL DATA:  Left rib fractures, pneumothorax. EXAM: CHEST - 2 VIEW COMPARISON:  10/07/2018 and 10/05/2019. FINDINGS: Trachea is midline. Heart size stable. Thoracic aorta is calcified. Lingular and left lower lobe airspace opacification and moderate left pleural effusion are unchanged. Trace left apical pneumothorax, stable. Right lung is clear. Biapical pleural thickening. Lower left rib fractures are again noted. IMPRESSION: 1. Moderate left pleural effusion and trace left apical pneumothorax, stable. 2. Lingular and left lower lobe airspace opacification is stable and indicative of atelectasis and/or pneumonia. Electronically Signed   By: Lorin Picket M.D.   On: 10/09/2019 08:04   CT CHEST WO  CONTRAST  Result Date: 10/05/2019 CLINICAL DATA:  [place] several days ago. Worsening left-sided chest pain. EXAM: CT CHEST WITHOUT CONTRAST TECHNIQUE: Multidetector CT imaging of the chest was performed following the standard protocol without IV contrast. COMPARISON:  Chest x-ray 10/04/2019 FINDINGS: Cardiovascular: The heart is normal in size. No pericardial effusion. Moderate tortuosity, mild ectasia and moderate calcification of the thoracic aorta. There are also moderate calcifications at the aortic valve and there are three-vessel coronary artery calcifications. Mediastinum/Nodes: No mediastinal or hilar mass or adenopathy. No mediastinal hematoma. The esophagus is grossly normal. Lungs/Pleura: There is a large complex left pleural effusion with areas of mixed attenuation consistent with hemothorax and pleural fluid. There is significant overlying left lower lobe atelectasis and some lingular atelectasis also. The right lung is grossly clear. There is fairly significant breathing motion artifact but I do not see any obvious worrisome pulmonary lesions. Underlying  emphysematous changes are noted. Upper Abdomen: No significant upper abdominal findings. The liver and spleen appear intact. There appears to be contrast in the kidneys, likely from prior contrast study. No free air or free fluid. Moderate aortic calcifications. The gallbladder is surgically absent. Musculoskeletal: Numerous displaced left-sided rib fractures as demonstrated on the radiographs. The eleventh rib is fractured posteriorly near its articulation with the spine. Mildly displaced tenth posterolateral rib fracture and small fracture posteriorly near its articulation with the spine. The seventh, eighth and ninth ribs demonstrate significantly displaced fractures posterolaterally. No other definite rib fractures. The sternum is intact. The thoracic vertebral bodies are intact. Surgical changes involving the right shoulder likely from a rotator  cuff repair. IMPRESSION: 1. Several displaced left-sided rib fractures as detailed above. 2. Large complex left pleural effusion with areas of mixed attenuation consistent with hemothorax and pleural fluid. Significant overlying atelectasis. No pneumothorax. 3. Underlying emphysematous changes but no pulmonary contusion or worrisome pulmonary lesions. 4. No mediastinal mass or hematoma. 5. No significant upper abdominal findings. 6. Atherosclerotic calcifications involving the aorta and coronary arteries. Aortic Atherosclerosis (ICD10-I70.0) and Emphysema (ICD10-J43.9). Aortic Atherosclerosis (ICD10-I70.0) and Emphysema (ICD10-J43.9). Electronically Signed   By: Marijo Sanes M.D.   On: 10/05/2019 09:30   CARDIAC CATHETERIZATION  Result Date: 10/04/2019  Prox RCA lesion is 90% stenosed.  RV Branch lesion is 90% stenosed.  RPDA lesion is 60% stenosed.  Dist Cx lesion is 99% stenosed.  2nd Mrg lesion is 70% stenosed.  Prox Cx lesion is 60% stenosed.  Ost LAD to Prox LAD lesion is 30% stenosed.  Mid LAD lesion is 40% stenosed.  1. Multi-vessel CAD 2. Mild to moderate LAD stenosis 3. Moderate proximal Circumflex stenosis. Severe distal Circumflex stenosis (co-dominant). Severe stenosis in the moderate caliber obtuse marginal branch 4. Severe stenosis in the mid RCA. The RV marginal branch has severe stenosis. The PDA has moderate stenosis. 5. Normal LVEDP 6. Moderate aortic stenosis by cath (mean gradient 28.3 mmHg, peak to peak gradient 17 mmHg) Recommendations: He has severe, multi-vessel CAD. There is flow down all of the vessels. He also has at least moderate aortic stenosis. At this time, he has minimal chest pain. LV filling pressures are normal. No evidence of aortic dissection by aortic root angiography. Will admit to the ICU. Will start a beta blocker and continue statin and ASA. I will resume IV heparin 4 hours post sheath pull. The differential includes aortic dissection that could not be seen on  the aortic root angiography, PE and acute decompensation due to the combination of CAD and AS. Will arrange an echo in am. Will check a d-dimer tonight. Given renal insufficiency, will not perform CTA tonight to exclude dissection/PE. As above, heparin will be started. Cycle troponin.   DG Chest 1V REPEAT Same Day  Result Date: 10/07/2019 CLINICAL DATA:  Post chest tube removal. EXAM: CHEST - 1 VIEW SAME DAY COMPARISON:  10/07/2019 FINDINGS: Interval removal of left basilar chest tube. Lungs are adequately inflated with persistent opacification over the left base likely effusion with associated atelectasis. Infection in the left base is possible. Likely tiny left apical pneumothorax unchanged. Cardiomediastinal silhouette and remainder of the exam including multiple left rib fractures is unchanged. IMPRESSION: 1. Interval removal left basilar chest tube with stable left base opacification likely effusion with atelectasis. Infection in the left base is possible. 2. Findings suggesting stable persistent tiny left apical pneumothorax. Electronically Signed   By: Marin Olp M.D.   On: 10/07/2019 11:44  DG Chest Port 1 View  Result Date: 10/08/2019 CLINICAL DATA:  Chest pain after fall EXAM: PORTABLE CHEST 1 VIEW COMPARISON:  Yesterday FINDINGS: Unchanged pleural fluid and pulmonary opacity on the left adjacent to rib fractures. Trace left apical pneumothorax is unchanged. The right lung is clear. Stable heart size. IMPRESSION: Stable pleural fluid and trace pneumothorax on the left. Electronically Signed   By: Monte Fantasia M.D.   On: 10/08/2019 06:04   DG CHEST PORT 1 VIEW  Result Date: 10/07/2019 CLINICAL DATA:  Hemothorax on the left EXAM: PORTABLE CHEST 1 VIEW COMPARISON:  Yesterday FINDINGS: Left-sided rib fractures with pleural effusion and lower lobe opacification. A small left apical pneumothorax is unchanged. Stable chest tube positioning. IMPRESSION: Stable hemopneumothorax on the left.  Electronically Signed   By: Monte Fantasia M.D.   On: 10/07/2019 06:31   DG Chest Port 1 View  Result Date: 10/06/2019 CLINICAL DATA:  Chest tube.  Shortness of breath. EXAM: PORTABLE CHEST 1 VIEW COMPARISON:  10/05/2019.  CT 10/05/2019. FINDINGS: Left chest tube in stable position. Stable tiny left apical pneumothorax. Mediastinum and hilar structures normal. Heart size normal. Persistent left base infiltrate and left-sided pleural effusion. Previously identified density noted over the anterior right third rib represents subsegmental atelectasis and is improved from prior exam. Displaced left rib fractures again noted. Postsurgical changes right shoulder. IMPRESSION: 1. Persistent left base atelectasis/infiltrate and left-sided pleural effusion. No interim change. 2. Previously identified density noted over the right anterior third rib represented subsegmental atelectasis. Interim improvement from prior exam. 3. Multiple left rib fractures again noted. Stable tiny left apical pneumothorax. Left chest tube in stable position. Electronically Signed   By: Marcello Moores  Register   On: 10/06/2019 07:47   DG Chest Portable 1 View  Result Date: 10/04/2019 CLINICAL DATA:  Shortness of breath and chest pain. EXAM: PORTABLE CHEST 1 VIEW COMPARISON:  None. FINDINGS: Upper normal heart size with normal mediastinal contours. Aortic atherosclerosis dense retrocardiac opacity with moderate left pleural effusion. No evidence of pulmonary edema. No focal airspace disease in the right lung. No pneumothorax. Displaced right seventh and eighth rib fractures of indeterminate acuity. IMPRESSION: 1. Dense retrocardiac opacity with moderate left pleural effusion, may be atelectasis or pneumonia. Recommend radiographic follow-up to document resolution. 2. Displaced right seventh and eighth rib fractures of indeterminate acuity. Aortic Atherosclerosis (ICD10-I70.0). Electronically Signed   By: Keith Rake M.D.   On: 10/04/2019 23:53    ECHOCARDIOGRAM COMPLETE  Result Date: 10/05/2019    ECHOCARDIOGRAM REPORT   Patient Name:   DYAN LITTEN Date of Exam: 10/05/2019 Medical Rec #:  [id number]          Height:       67.0 in Accession #:    [id number]         Weight:       166.0 lb Date of Birth:  1941-03-25          BSA:          1.87 m Patient Age:    89 years           BP:           115/66 mmHg Patient Gender: M                  HR:           102 bpm. Exam Location:  Inpatient Procedure: 2D Echo Indications:    aortic stenosis 424.1  History:  Patient has no prior history of Echocardiogram examinations.  Sonographer:    Johny Chess Referring Phys: [place]  1. Left ventricular ejection fraction, by estimation, is 65 to 70%. The left ventricle has hyperdynamic function. The left ventricle has no regional wall motion abnormalities. There is mild left ventricular hypertrophy. Left ventricular diastolic parameters are consistent with Grade I diastolic dysfunction (impaired relaxation).  2. Right ventricular systolic function is normal. The right ventricular size is normal.  3. The mitral valve is normal in structure and function. No evidence of mitral valve regurgitation. No evidence of mitral stenosis.  4. The aortic valve is tricuspid. Aortic valve regurgitation is not visualized. Moderate aortic valve stenosis. Aortic valve area, by VTI measures 1.03 cm. Aortic valve mean gradient measures 22.0 mmHg.  5. The inferior vena cava is normal in size with greater than 50% respiratory variability, suggesting right atrial pressure of 3 mmHg. FINDINGS  Left Ventricle: Left ventricular ejection fraction, by estimation, is 65 to 70%. The left ventricle has hyperdynamic function. The left ventricle has no regional wall motion abnormalities. The left ventricular internal cavity size was normal in size. There is mild left ventricular hypertrophy. Left ventricular diastolic parameters are consistent with Grade I  diastolic dysfunction (impaired relaxation). Right Ventricle: The right ventricular size is normal. No increase in right ventricular wall thickness. Right ventricular systolic function is normal. Left Atrium: Left atrial size was normal in size. Right Atrium: Right atrial size was normal in size. Pericardium: There is no evidence of pericardial effusion. Mitral Valve: The mitral valve is normal in structure and function. No evidence of mitral valve regurgitation. No evidence of mitral valve stenosis. Tricuspid Valve: The tricuspid valve is normal in structure. Tricuspid valve regurgitation is not demonstrated. Aortic Valve: The aortic valve is tricuspid. Aortic valve regurgitation is not visualized. Moderate aortic stenosis is present. Aortic valve mean gradient measures 22.0 mmHg. Aortic valve peak gradient measures 45.0 mmHg. Aortic valve area, by VTI measures 1.03 cm. Pulmonic Valve: The pulmonic valve was normal in structure. Pulmonic valve regurgitation is not visualized. Aorta: The aortic root is normal in size and structure. Venous: The inferior vena cava is normal in size with greater than 50% respiratory variability, suggesting right atrial pressure of 3 mmHg. IAS/Shunts: No atrial level shunt detected by color flow Doppler.  LEFT VENTRICLE PLAX 2D LVIDd:         4.25 cm  Diastology LVIDs:         3.70 cm  LV e' lateral:   5.00 cm/s LV PW:         0.90 cm  LV E/e' lateral: 14.4 LV IVS:        1.10 cm  LV e' medial:    5.55 cm/s LVOT diam:     2.10 cm  LV E/e' medial:  13.0 LV SV:         54.90 ml LV SV Index:   11.94 LVOT Area:     3.46 cm  RIGHT VENTRICLE RV S prime:     10.80 cm/s LEFT ATRIUM             Index       RIGHT ATRIUM           Index LA diam:        3.40 cm 1.82 cm/m  RA Area:     12.90 cm LA Vol (A2C):   32.5 ml 17.40 ml/m RA Volume:   28.50 ml  15.25 ml/m  LA Vol (A4C):   34.2 ml 18.31 ml/m LA Biplane Vol: 36.4 ml 19.48 ml/m  AORTIC VALVE AV Area (Vmax):    0.95 cm AV Area (Vmean):    0.92 cm AV Area (VTI):     1.03 cm AV Vmax:           335.50 cm/s AV Vmean:          208.000 cm/s AV VTI:            0.532 m AV Peak Grad:      45.0 mmHg AV Mean Grad:      22.0 mmHg LVOT Vmax:         92.05 cm/s LVOT Vmean:        55.200 cm/s LVOT VTI:          0.158 m LVOT/AV VTI ratio: 0.30  AORTA Ao Root diam: 3.10 cm MV E velocity: 72.00 cm/s MV A velocity: 128.00 cm/s  SHUNTS MV E/A ratio:  0.56         Systemic VTI:  0.16 m                             Systemic Diam: 2.10 cm Loralie Champagne MD Electronically signed by Loralie Champagne MD Signature Date/Time: 10/05/2019/2:43:59 PM    Final    Disposition   Pt is being discharged home today in good condition.  Follow-up Plans & Appointments    Follow-up Information    Ellis Parents, MD Follow up.   Specialty: Internal Medicine Why: Please follow up with Dr. Mauricio Po in 1 week.  You should have your chest tube suture removed around March 1st.  Contact information: [place] [address] [phone]        Wayland Salinas, MD Follow up.   Specialty: Family Medicine Why: Follow up with primary care as soon as you can. Labs need to be done to recheck kidney function.  Contact information: [place] [address] [phone]          Discharge Instructions    Diet - low sodium heart healthy   Complete by: As directed    Discharge instructions   Complete by: As directed    PLEASE REMEMBER TO BRING ALL OF YOUR MEDICATIONS TO EACH OF YOUR FOLLOW-UP OFFICE VISITS.  PLEASE ATTEND ALL SCHEDULED FOLLOW-UP APPOINTMENTS.   Activity: Increase activity slowly as tolerated. You may shower, but no soaking baths (or swimming) for 1 week. No driving for 24 hours. No lifting over 5 lbs for 1 week. No sexual activity for 1 week.   You May Return to Work: in 1 week (if applicable)  Wound Care: You may wash cath site gently with soap and water. Keep cath site clean and dry. If  you notice pain, swelling, bleeding or pus at your cath site, please call [phone].   Increase activity slowly   Complete by: As directed       Discharge Medications   Allergies as of 10/09/2019      Reactions   Aspirin Anaphylaxis, Nausea And Vomiting   Hydrocodone Anaphylaxis, Nausea And Vomiting   Hydrocodone-acetaminophen Anaphylaxis   Oxycodone Anaphylaxis   Fenofibrate Other (See Comments)   Leg pain   Gemfibrozil Other (See Comments)   Muscle cramps   Statins Other (See Comments)   Muscle aches/myalgias   Hydrochlorothiazide Other (See Comments)   Cramps   Tramadol Other (See Comments)  Constipation      Medication List    STOP taking these medications   chlorthalidone 25 MG tablet Commonly known as: HYGROTON   clopidogrel 75 MG tablet Commonly known as: PLAVIX   hydrALAZINE 50 MG tablet Commonly known as: APRESOLINE   lisinopril 40 MG tablet Commonly known as: ZESTRIL     TAKE these medications   ALPRAZolam 0.5 MG tablet Commonly known as: XANAX Take 0.5 mg by mouth See admin instructions. Take 0.5 mg by mouth at bedtime and 0.5 mg once daily as needed for anxiety   aspirin EC 81 MG tablet Take 81 mg by mouth daily.   ezetimibe 10 MG tablet Commonly known as: ZETIA Take 10 mg by mouth at bedtime.   Metoprolol Tartrate 37.5 MG Tabs Take 37.5 mg by mouth 2 (two) times daily.   predniSONE 20 MG tablet Commonly known as: DELTASONE Take 10-40 mg by mouth See admin instructions. Take 40 mg by mouth once daily for 3 days, 20 mg once daily for 3 days, and 10 mg once daily for 2 days   rosuvastatin 20 MG tablet Commonly known as: CRESTOR Take 1 tablet (20 mg total) by mouth daily. Start taking on: October 10, 2019 What changed:   medication strength  how much to take   SYSTANE FREE OP Place 1-2 drops into both eyes as needed.          Outstanding Labs/Studies   Follow up renal function per PCP or primary cardiologist.   Duration of  Discharge Encounter   Greater than 30 minutes including physician time.  Signed, Daune Perch, NP 10/09/2019, 1:15 PM

## 2019-10-09 NOTE — Care Management Important Message (Signed)
Important Message  Patient Details  Name: Scott Little MRN: [id number] Date of Birth: 20-Apr-1941   Medicare Important Message Given:  Yes     Shelda Altes 10/09/2019, 1:27 PM

## 2019-10-09 NOTE — Discharge Instructions (Signed)
Please have your suture removed from chest tube site around March first. Your cardiologist or primary care provider can remove it.

## 2019-10-10 ENCOUNTER — Telehealth: Payer: Self-pay

## 2019-10-10 ENCOUNTER — Other Ambulatory Visit: Payer: Self-pay

## 2019-10-10 MED ORDER — METOPROLOL TARTRATE 37.5 MG PO TABS: 37.5000 mg | ORAL_TABLET | Freq: Two times a day (BID) | ORAL | 2 refills | Status: DC

## 2019-10-10 NOTE — Telephone Encounter (Signed)
**Note De-Identified Tanvi Gatling Obfuscation** We received a fax from [place] stating that the pts plan will not cover the pts Metoprolol Tartrate 37.5 mg BID RX. I checked the pts med list and noticed that a quantity of #60 was sent in but the pt will #90 for a 30 day supply.  I sent the RX in again and then did a Metorolol PA through covermymeds and received the following message: Laurens Sahai Key: [id number] - PA Case ID: [id number] Outcome  This medication or product is on your plan's list of covered drugs. Prior authorization is not required at this time. If your pharmacy has questions regarding the processing of your prescription, please have them call the [facility] pharmacy help desk at [phone] Drug Metoprolol Tartrate 25MG  tablets  FormOptumRx Medicare Part D Electronic Prior Authorization Form (2017 NCPDP)   I called [facility] and advised T[place] that per covermymeds this PA is not required and that I sent them a new RX with correct quantity amount.

## 2019-11-06 ENCOUNTER — Telehealth: Payer: Self-pay | Admitting: *Deleted

## 2019-11-06 MED ORDER — METOPROLOL TARTRATE 25 MG PO TABS: 37.5000 mg | ORAL_TABLET | Freq: Two times a day (BID) | ORAL | 3 refills | Status: AC

## 2019-11-06 NOTE — Telephone Encounter (Signed)
Received a fax from pt's pharmacy that insurance wouldn't cover the Metoprolol 37.5 bid so had to change it to metoprolol 25 mg taking 1 1/2 tablets bid.

## 2020-01-11 ENCOUNTER — Telehealth (HOSPITAL_COMMUNITY): Payer: Self-pay

## 2020-01-11 NOTE — Telephone Encounter (Signed)
Outside/paper referral received by Dr. Radford Pax from [facility]. Will fax over Physician order and request further documents. Insurance benefits and eligibility to be determined.

## 2020-01-11 NOTE — Telephone Encounter (Signed)
Pt insurance is active and benefits verified through [facility]. Co-pay $0.00, DED $0.00/$0.00 met, out of pocket $3,600.00/$2,255.04 met, co-insurance 0%. No pre-authorization required. Passport, 01/11/20 @ 2:35PM, KWI#[id number]  Will contact patient to see if he is interested in the Cardiac Rehab Program. Patient will be contacted for scheduling upon review by the RN Navigator.

## 2020-02-23 ENCOUNTER — Encounter (HOSPITAL_COMMUNITY): Payer: Self-pay | Admitting: *Deleted

## 2020-02-23 NOTE — Progress Notes (Signed)
Received signed cardiac rehab referral form by Dr. Valentino Hue who performed on 12/28/19 DES x 3 at [place]. Clinical review of pt follow up appt on 6/7 with Cathlean Sauer NP with Dr. Mauricio Po- cardiologist office note.  Also reviewed pt follow up app with his PCP  on 6/15. Pt is making the expected progress in recovery.  Pt appropriate for scheduling for on site cardiac rehab and/or enrollment in Virtual Cardiac Rehab.  Pt Covid score is 4.  Will forward to staff for follow up. Cherre Huger, BSN Cardiac and Training and development officer

## 2020-02-28 ENCOUNTER — Telehealth (HOSPITAL_COMMUNITY): Payer: Self-pay

## 2020-02-28 NOTE — Telephone Encounter (Signed)
Called patient to see if he was interested in participating in the [facility]. Patient stated not at this time due to his other health issues.  Closed referral

## 2020-09-28 IMAGING — DX DG CHEST 2V
2 series · 2 of 2 positions shown · non-contrast
Comparison: 10/07/2018 and 10/05/2019.

CLINICAL DATA: Left rib fractures, pneumothorax.

EXAM:
CHEST - 2 VIEW

[chest lat]
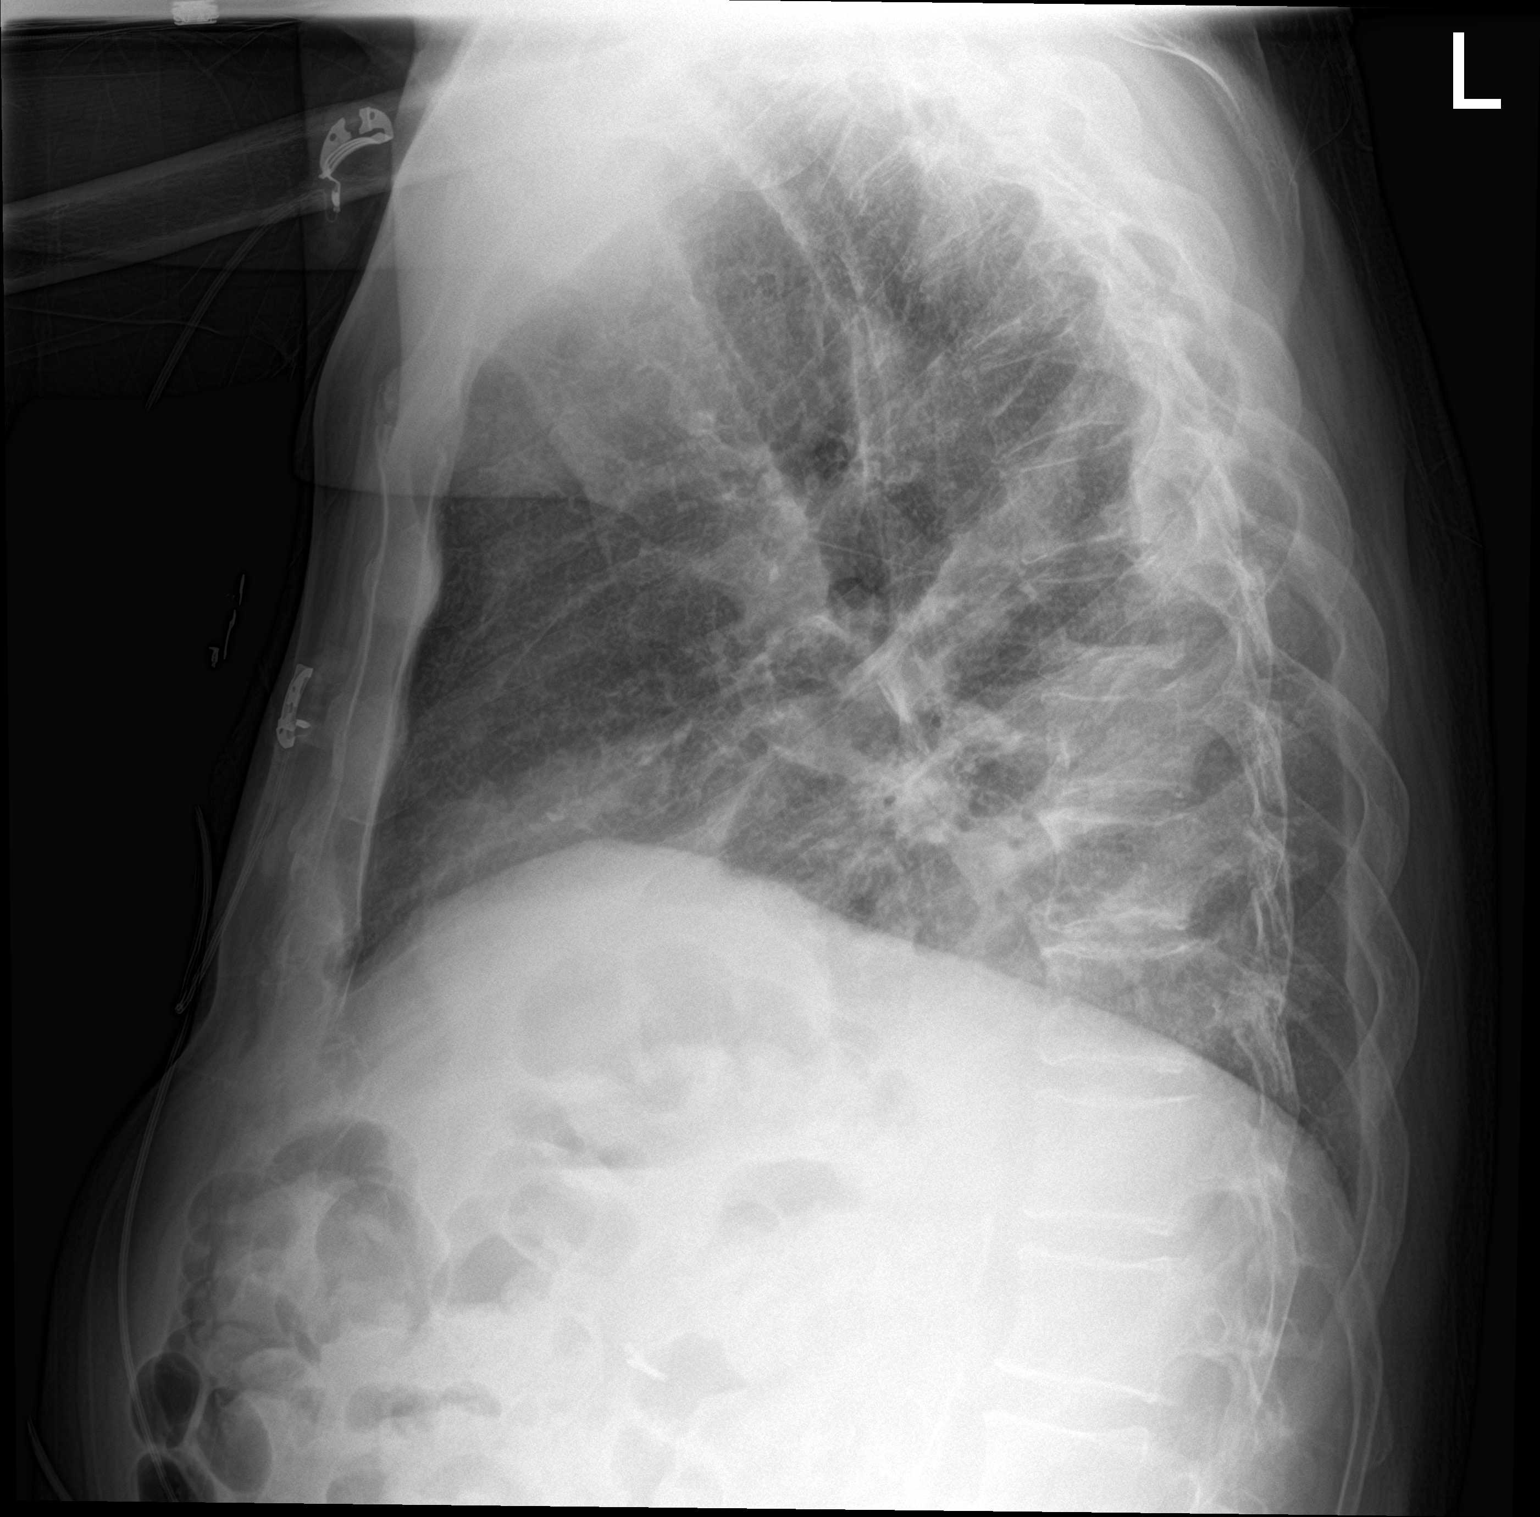

[chest ap]
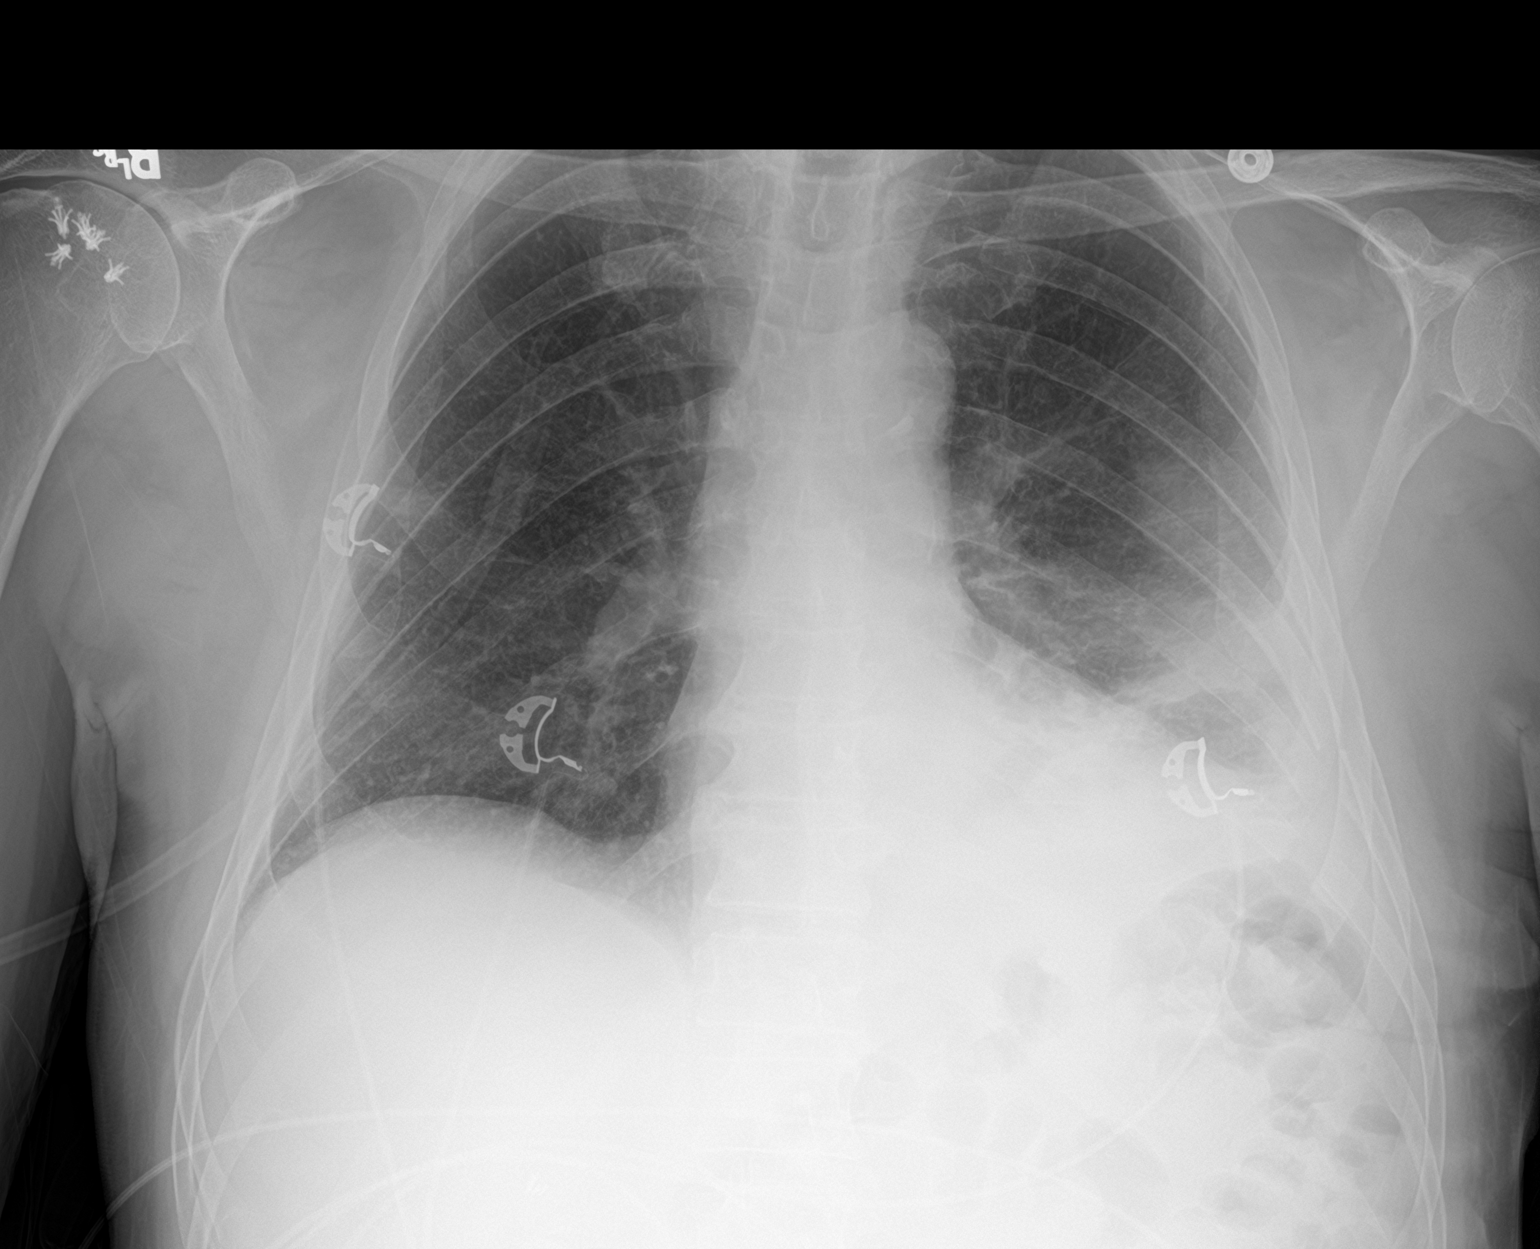

[2 of 2 positions shown; findings below may reference images not displayed]

FINDINGS: Trachea is midline. Heart size stable. Thoracic aorta is calcified.
Lingular and left lower lobe airspace opacification and moderate
left pleural effusion are unchanged. Trace left apical pneumothorax,
stable. Right lung is clear. Biapical pleural thickening. Lower left
rib fractures are again noted.
IMPRESSION: 1. Moderate left pleural effusion and trace left apical
pneumothorax, stable.
2. Lingular and left lower lobe airspace opacification is stable and
indicative of atelectasis and/or pneumonia.
